# Patient Record
Sex: Female | Born: 1940 | Race: White | Hispanic: No | Smoking: Former smoker
Health system: Southern US, Community
[De-identification: ages and names within clinical notes are randomized; demographics above are authoritative.]

## PROBLEM LIST (undated history)

## (undated) DIAGNOSIS — I10 Essential (primary) hypertension: Secondary | ICD-10-CM

## (undated) DIAGNOSIS — E119 Type 2 diabetes mellitus without complications: Secondary | ICD-10-CM

## (undated) HISTORY — PX: APPENDECTOMY: SHX54

## (undated) HISTORY — PX: CHOLECYSTECTOMY: SHX55

## (undated) HISTORY — PX: ABDOMINAL HYSTERECTOMY: SHX81

---

## 2015-12-11 ENCOUNTER — Encounter (HOSPITAL_BASED_OUTPATIENT_CLINIC_OR_DEPARTMENT_OTHER): Payer: Self-pay | Admitting: *Deleted

## 2015-12-11 ENCOUNTER — Emergency Department (HOSPITAL_BASED_OUTPATIENT_CLINIC_OR_DEPARTMENT_OTHER): Payer: Medicare HMO

## 2015-12-11 ENCOUNTER — Emergency Department (HOSPITAL_BASED_OUTPATIENT_CLINIC_OR_DEPARTMENT_OTHER)
Admission: EM | Admit: 2015-12-11 | Discharge: 2015-12-11 | Disposition: A | Discharge status: Home / Self Care | Payer: Medicare HMO | Attending: Emergency Medicine | Admitting: Emergency Medicine

## 2015-12-11 DIAGNOSIS — E119 Type 2 diabetes mellitus without complications: Secondary | ICD-10-CM | POA: Diagnosis not present

## 2015-12-11 DIAGNOSIS — Y939 Activity, unspecified: Secondary | ICD-10-CM | POA: Insufficient documentation

## 2015-12-11 DIAGNOSIS — S62501A Fracture of unspecified phalanx of right thumb, initial encounter for closed fracture: Secondary | ICD-10-CM

## 2015-12-11 DIAGNOSIS — W010XXA Fall on same level from slipping, tripping and stumbling without subsequent striking against object, initial encounter: Secondary | ICD-10-CM | POA: Insufficient documentation

## 2015-12-11 DIAGNOSIS — I10 Essential (primary) hypertension: Secondary | ICD-10-CM | POA: Diagnosis not present

## 2015-12-11 DIAGNOSIS — Y999 Unspecified external cause status: Secondary | ICD-10-CM | POA: Insufficient documentation

## 2015-12-11 DIAGNOSIS — Z79899 Other long term (current) drug therapy: Secondary | ICD-10-CM | POA: Insufficient documentation

## 2015-12-11 DIAGNOSIS — S6991XA Unspecified injury of right wrist, hand and finger(s), initial encounter: Secondary | ICD-10-CM | POA: Diagnosis present

## 2015-12-11 DIAGNOSIS — Y92832 Beach as the place of occurrence of the external cause: Secondary | ICD-10-CM | POA: Diagnosis not present

## 2015-12-11 DIAGNOSIS — S62511A Displaced fracture of proximal phalanx of right thumb, initial encounter for closed fracture: Secondary | ICD-10-CM | POA: Diagnosis not present

## 2015-12-11 DIAGNOSIS — Z794 Long term (current) use of insulin: Secondary | ICD-10-CM | POA: Diagnosis not present

## 2015-12-11 HISTORY — DX: Type 2 diabetes mellitus without complications: E11.9

## 2015-12-11 HISTORY — DX: Essential (primary) hypertension: I10

## 2015-12-11 MED ORDER — HYDROCODONE-ACETAMINOPHEN 5-325 MG PO TABS: 1.0000 | ORAL_TABLET | Freq: Four times a day (QID) | ORAL | Status: DC | PRN

## 2015-12-11 NOTE — Discharge Instructions (Signed)
Continue to wear splint as applied.  Hydrocodone as prescribed as needed for pain.  Follow up with your orthopedist within the next week for a recheck.   Finger Fracture Fractures of fingers are breaks in the bones of the fingers. There are many types of fractures. There are different ways of treating these fractures. Your health care provider will discuss the best way to treat your fracture. CAUSES Traumatic injury is the main cause of broken fingers. These include:  Injuries while playing sports.  Workplace injuries.  Falls. RISK FACTORS Activities that can increase your risk of finger fractures include:  Sports.  Workplace activities that involve machinery.  A condition called osteoporosis, which can make your bones less dense and cause them to fracture more easily. SIGNS AND SYMPTOMS The main symptoms of a broken finger are pain and swelling within 15 minutes after the injury. Other symptoms include:  Bruising of your finger.  Stiffness of your finger.  Numbness of your finger.  Exposed bones (compound fracture) if the fracture is severe. DIAGNOSIS  The best way to diagnose a broken bone is with X-ray imaging. Additionally, your health care provider will use this X-ray image to evaluate the position of the broken finger bones.  TREATMENT  Finger fractures can be treated with:   Nonreduction--This means the bones are in place. The finger is splinted without changing the positions of the bone pieces. The splint is usually left on for about a week to 10 days. This will depend on your fracture and what your health care provider thinks.  Closed reduction--The bones are put back into position without using surgery. The finger is then splinted.  Open reduction and internal fixation--The fracture site is opened. Then the bone pieces are fixed into place with pins or some type of hardware. This is seldom required. It depends on the severity of the fracture. HOME CARE  INSTRUCTIONS   Follow your health care provider's instructions regarding activities, exercises, and physical therapy.  Only take over-the-counter or prescription medicines for pain, discomfort, or fever as directed by your health care provider. SEEK MEDICAL CARE IF: You have pain or swelling that limits the motion or use of your fingers. SEEK IMMEDIATE MEDICAL CARE IF:  Your finger becomes numb. MAKE SURE YOU:   Understand these instructions.  Will watch your condition.  Will get help right away if you are not doing well or get worse.   This information is not intended to replace advice given to you by your health care provider. Make sure you discuss any questions you have with your health care provider.   Document Released: 11/02/2000 Document Revised: 05/11/2013 Document Reviewed: 03/02/2013 Elsevier Interactive Patient Education [employer]

## 2015-12-11 NOTE — ED Provider Notes (Signed)
CSN: [id number]     Arrival date & time 12/11/15  1704 History   First MD Initiated Contact with Patient 12/11/15 1741     Chief Complaint  Patient presents with  . Hand Injury     (Consider location/radiation/quality/duration/timing/severity/associated sxs/prior Treatment) HPI Comments: Patient is a 75 year old female with history of hypertension and diabetes. She presents for evaluation of a right thumb injury. She reports that she fell [place] She [place] there and an x-ray revealed a fracture of her thumb. She was placed in a thumb spica splint and advised to follow-up with orthopedics when she arrived home. She has been unable to arrange orthopedic follow-up and presents here tonight to have her thumb evaluated.  Patient is a 75 y.o. female presenting with hand injury. The history is provided by the patient.  Hand Injury Location:  Hand Time since incident:  4 days Injury: yes   Hand location:  R hand Pain details:    Radiates to:  Does not radiate   Severity:  Moderate   Onset quality:  Sudden   Past Medical History  Diagnosis Date  . Diabetes mellitus without complication (HCC)   . Hypertension    Past Surgical History  Procedure Laterality Date  . Abdominal hysterectomy    . Cholecystectomy    . Appendectomy     No family history on file. Social History  Substance Use Topics  . Smoking status: Never Smoker   . Smokeless tobacco: None  . Alcohol Use: No   OB History    No data available     Review of Systems  All other systems reviewed and are negative.     Allergies  Codeine  Home Medications   Prior to Admission medications   Medication Sig Start Date End Date Taking? Authorizing Provider  insulin aspart (NOVOLOG) 100 unit/mL injection Inject 10 Units into the skin 3 (three) times daily before meals.   Yes Historical Provider, MD  Insulin Detemir (LEVEMIR) 100 UNIT/ML Pen Inject into the skin daily at 10 pm.   Yes  Historical Provider, MD  LISINOPRIL PO Take by mouth.   Yes Historical Provider, MD  Propranolol HCl (INDERAL PO) Take by mouth.   Yes Historical Provider, MD  TRAZODONE HCL PO Take by mouth.   Yes Historical Provider, MD   BP 130/73 mmHg  Pulse 77  Temp(Src) 99.5 F (37.5 C) (Oral)  Resp 18  Ht  (1.6 m)  Wt 168 lb (76.204 kg)  BMI 29.77 kg/m2  SpO2 96% Physical Exam  Constitutional: She is oriented to person, place, and time. She appears well-developed and well-nourished. No distress.  HENT:  Head: Normocephalic and atraumatic.  Neck: Normal range of motion. Neck supple.  Musculoskeletal:  There is some ecchymosis over the first metacarpal of the right hand. The thumb otherwise appears grossly normal. She has good range of motion of the wrist.  Neurological: She is alert and oriented to person, place, and time.  Skin: Skin is warm and dry. She is not diaphoretic.  Vitals reviewed.   ED Course  Procedures (including critical care time) Labs Review Labs Reviewed - No data to display  Imaging Review No results found. I have personally reviewed and evaluated these images and lab results as part of my medical decision-making.   EKG Interpretation None      MDM   Final diagnoses:  None    X-rays reveal a minimally displaced fracture at the base  of the right thumb. She is wearing a thumb spica which was applied at the outside clinic. I will advise her to continue to wear this and follow-up with her orthopedist within the next week.    Geoffery Lyons, MD 12/11/15 [phone]

## 2015-12-11 NOTE — ED Notes (Addendum)
Right thumb injury. She tripped and fell a week ago. [place] and xray showed a fracture. She is wearing a brace. Here for continued pain.

## 2016-04-21 ENCOUNTER — Emergency Department (HOSPITAL_BASED_OUTPATIENT_CLINIC_OR_DEPARTMENT_OTHER)
Admission: EM | Admit: 2016-04-21 | Discharge: 2016-04-22 | Disposition: A | Discharge status: Home / Self Care | Payer: Medicare HMO | Attending: Emergency Medicine | Admitting: Emergency Medicine

## 2016-04-21 ENCOUNTER — Emergency Department (HOSPITAL_BASED_OUTPATIENT_CLINIC_OR_DEPARTMENT_OTHER): Payer: Medicare HMO

## 2016-04-21 ENCOUNTER — Encounter (HOSPITAL_BASED_OUTPATIENT_CLINIC_OR_DEPARTMENT_OTHER): Payer: Self-pay

## 2016-04-21 DIAGNOSIS — R109 Unspecified abdominal pain: Secondary | ICD-10-CM | POA: Diagnosis not present

## 2016-04-21 DIAGNOSIS — R9431 Abnormal electrocardiogram [ECG] [EKG]: Secondary | ICD-10-CM | POA: Insufficient documentation

## 2016-04-21 DIAGNOSIS — Z87891 Personal history of nicotine dependence: Secondary | ICD-10-CM | POA: Insufficient documentation

## 2016-04-21 DIAGNOSIS — R079 Chest pain, unspecified: Secondary | ICD-10-CM | POA: Insufficient documentation

## 2016-04-21 DIAGNOSIS — Z79899 Other long term (current) drug therapy: Secondary | ICD-10-CM | POA: Diagnosis not present

## 2016-04-21 DIAGNOSIS — Z794 Long term (current) use of insulin: Secondary | ICD-10-CM | POA: Diagnosis not present

## 2016-04-21 DIAGNOSIS — I10 Essential (primary) hypertension: Secondary | ICD-10-CM | POA: Insufficient documentation

## 2016-04-21 DIAGNOSIS — E119 Type 2 diabetes mellitus without complications: Secondary | ICD-10-CM | POA: Insufficient documentation

## 2016-04-21 LAB — HEPATIC FUNCTION PANEL
ALBUMIN: 3.4 g/dL — AB (ref 3.5–5.0)
ALT: 22 U/L (ref 14–54)
AST: 26 U/L (ref 15–41)
Alkaline Phosphatase: 58 U/L (ref 38–126)
BILIRUBIN DIRECT: 0.2 mg/dL (ref 0.1–0.5)
BILIRUBIN TOTAL: 1.3 mg/dL — AB (ref 0.3–1.2)
Indirect Bilirubin: 1.1 mg/dL — ABNORMAL HIGH (ref 0.3–0.9)
Total Protein: 6.5 g/dL (ref 6.5–8.1)

## 2016-04-21 LAB — BASIC METABOLIC PANEL
Anion gap: 7 (ref 5–15)
BUN: 11 mg/dL (ref 6–20)
CALCIUM: 9.2 mg/dL (ref 8.9–10.3)
CHLORIDE: 103 mmol/L (ref 101–111)
CO2: 26 mmol/L (ref 22–32)
CREATININE: 0.43 mg/dL — AB (ref 0.44–1.00)
GFR calc Af Amer: >60 mL/min (ref >60)
GFR calc non Af Amer: >60 mL/min (ref >60)
Glucose, Bld: 321 mg/dL — ABNORMAL HIGH (ref 65–99)
Potassium: 4 mmol/L (ref 3.5–5.1)
SODIUM: 136 mmol/L (ref 135–145)

## 2016-04-21 LAB — CBC
HCT: 42.2 % (ref 36.0–46.0)
Hemoglobin: 14.4 g/dL (ref 12.0–15.0)
MCH: 30.5 pg (ref 26.0–34.0)
MCHC: 34.1 g/dL (ref 30.0–36.0)
MCV: 89.4 fL (ref 78.0–100.0)
PLATELETS: 88 10*3/uL — AB (ref 150–400)
RBC: 4.72 MIL/uL (ref 3.87–5.11)
RDW: 13.3 % (ref 11.5–15.5)
WBC: 3.6 10*3/uL — AB (ref 4.0–10.5)

## 2016-04-21 LAB — LIPASE, BLOOD: LIPASE: 119 U/L — AB (ref 11–51)

## 2016-04-21 LAB — TROPONIN I
Troponin I: <0.03 ng/mL (ref <0.03)
Troponin I: <0.03 ng/mL (ref <0.03)

## 2016-04-21 LAB — CBG MONITORING, ED
GLUCOSE-CAPILLARY: 251 mg/dL — AB (ref 65–99)
GLUCOSE-CAPILLARY: 280 mg/dL — AB (ref 65–99)

## 2016-04-21 MED ORDER — IOPAMIDOL (ISOVUE-370) INJECTION 76%
100.0000 mL | Freq: Once | INTRAVENOUS | Status: AC | PRN
  Administered 2016-04-21: 100 mL via INTRAVENOUS

## 2016-04-21 MED ORDER — TRAZODONE HCL 150 MG PO TABS
150.0000 mg | ORAL_TABLET | Freq: Every day | ORAL | Status: DC
  Filled 2016-04-21: qty 1

## 2016-04-21 MED ORDER — MORPHINE SULFATE (PF) 4 MG/ML IV SOLN
4.0000 mg | Freq: Once | INTRAVENOUS | Status: DC
  Filled 2016-04-21: qty 1

## 2016-04-21 MED ORDER — INSULIN ASPART 100 UNIT/ML ~~LOC~~ SOLN
0.0000 [IU] | Freq: Three times a day (TID) | SUBCUTANEOUS | Status: DC
  Administered 2016-04-21: 8 [IU] via SUBCUTANEOUS
  Filled 2016-04-21: qty 1

## 2016-04-21 MED ORDER — ONDANSETRON HCL 4 MG/2ML IJ SOLN
4.0000 mg | Freq: Once | INTRAMUSCULAR | Status: DC
  Filled 2016-04-21: qty 2

## 2016-04-21 MED ORDER — PRAMIPEXOLE DIHYDROCHLORIDE 1 MG PO TABS
1.0000 mg | ORAL_TABLET | Freq: Once | ORAL | Status: DC
  Filled 2016-04-21: qty 1

## 2016-04-21 MED ORDER — DIAZEPAM 2 MG PO TABS
2.0000 mg | ORAL_TABLET | Freq: Once | ORAL | Status: AC
  Administered 2016-04-21: 2 mg via ORAL
  Filled 2016-04-21: qty 1

## 2016-04-21 NOTE — ED Triage Notes (Signed)
Pt reports one day of centralized chest pain, states home BP 172/88. Pt reports abdominal and epigastric pain as well.

## 2016-04-21 NOTE — ED Provider Notes (Signed)
[facility] Provider Note   CSN: [id number] Arrival date & time: 04/21/16  1612   By signing my name below, I, Kimberly [name], attest that this documentation has been prepared under the direction and in the presence of [name], MD . Electronically Signed: Christel [name], Scribe. 04/21/2016. 5:09 PM.   History   Chief Complaint Chief Complaint  Patient presents with  . Chest Pain    The history is provided by the patient. No language interpreter was used.   HPI Comments:  Kimberly [name] is a 75 y.o. female with PMHx of cirrhosis, DM, CHF, HLD and HTN who presents to the Emergency Department complaining of constant, centralized abdominal pain and chest pain that began this morning upon waking up from sleep. Pt states that she was baseline when she went to sleep last night. Pt describes the abdominal pain as "aching" and rates the pain at 8/10, and describes the chest pain as "dull and pressure" Pt notes no alleviating or aggravating factors. Pain not worsened with exertion, by eating, or with position. Pt denies any previous similar episodes. Pt has PSHx of hysterectomy, appendectomy, and cholecystectomy. Pt denies fever, nausea, vomiting, diarrhea, urinary symptoms, SOB, cough, back pain, leg swelling, blood in stool.   Past Medical History:  Diagnosis Date  . Diabetes mellitus without complication (HCC)   . Hypertension     There are no active problems to display for this patient.   Past Surgical History:  Procedure Laterality Date  . ABDOMINAL HYSTERECTOMY    . APPENDECTOMY    . CHOLECYSTECTOMY      OB History    No data available       Home Medications    Prior to Admission medications   Medication Sig Start Date End Date Taking? Authorizing Provider  insulin aspart (NOVOLOG) 100 unit/mL injection Inject 10 Units into the skin 3 (three) times daily before meals.   Yes Historical Provider, MD  Insulin Detemir (LEVEMIR) 100 UNIT/ML Pen Inject into  the skin daily at 10 pm.   Yes Historical Provider, MD  LISINOPRIL PO Take by mouth.   Yes Historical Provider, MD  Propranolol HCl (INDERAL PO) Take by mouth.   Yes Historical Provider, MD  TRAZODONE HCL PO Take by mouth.   Yes Historical Provider, MD  HYDROcodone-acetaminophen (NORCO) 5-325 MG tablet Take 1-2 tablets by mouth every 6 (six) hours as needed. 12/11/15   Geoffery Lyons, MD    Family History History reviewed. No pertinent family history.  Social History Social History  Substance Use Topics  . Smoking status: Former Games developer  . Smokeless tobacco: Never Used  . Alcohol use No     Allergies   Codeine   Review of Systems Review of Systems  Constitutional: Negative for fever.  Respiratory: Negative for cough and shortness of breath.   Cardiovascular: Positive for chest pain. Negative for leg swelling.  Gastrointestinal: Positive for abdominal pain. Negative for blood in stool, diarrhea, nausea and vomiting.  Genitourinary: Negative for dysuria.  Musculoskeletal: Negative for back pain.  All other systems reviewed and are negative.    Physical Exam Updated Vital Signs BP 123/69   Pulse 61   Temp 97.8 F (36.6 C) (Oral)   Resp 18   Ht 5\' 3"  (1.6 m)   Wt 164 lb (74.4 kg)   SpO2 98%   BMI 29.05 kg/m   Physical Exam Physical Exam  Nursing note and vitals reviewed. Constitutional: Well developed, well nourished, non-toxic, and in no  acute distress Head: Normocephalic and atraumatic.  Mouth/Throat: Oropharynx is clear and moist.  Neck: Normal range of motion. Neck supple.  Cardiovascular: Normal rate and regular rhythm.   Pulmonary/Chest: Effort normal and breath sounds normal.  Abdominal: Soft, non-distended. Tenderness to RUQ. There is no rebound and no guarding.  Musculoskeletal: Normal range of motion.  Neurological: Alert, no facial droop, fluent speech, moves all extremities symmetrically Skin: Skin is warm and dry.  Psychiatric: Cooperative  ED  Treatments / Results  DIAGNOSTIC STUDIES:  Oxygen Saturation is 99% on RA, normal by my interpretation.    COORDINATION OF CARE:  5:09 PM Discussed treatment plan with pt at bedside and pt agreed to plan.   Labs (all labs ordered are listed, but only abnormal results are displayed) Labs Reviewed  BASIC METABOLIC PANEL - Abnormal; Notable for the following:       Result Value   Glucose, Bld 321 (*)    Creatinine, Ser 0.43 (*)    All other components within normal limits  CBC - Abnormal; Notable for the following:    WBC 3.6 (*)    Platelets 88 (*)    All other components within normal limits  HEPATIC FUNCTION PANEL - Abnormal; Notable for the following:    Albumin 3.4 (*)    Total Bilirubin 1.3 (*)    Indirect Bilirubin 1.1 (*)    All other components within normal limits  LIPASE, BLOOD - Abnormal; Notable for the following:    Lipase 119 (*)    All other components within normal limits  CBG MONITORING, ED - Abnormal; Notable for the following:    Glucose-Capillary 251 (*)    All other components within normal limits  CBG MONITORING, ED - Abnormal; Notable for the following:    Glucose-Capillary 280 (*)    All other components within normal limits  TROPONIN I  TROPONIN I  TROPONIN I    EKG  EKG Interpretation  Date/Time:  Monday April 21 2016 16:19:04 EDT Ventricular Rate:  57 PR Interval:  176 QRS Duration: 78 QT Interval:  460 QTC Calculation: 447 R Axis:   -12 Text Interpretation:  Sinus bradycardia Left ventricular hypertrophy ST & T wave abnormality, consider lateral ischemia Abnormal ECG no prior EKG for comparison Confirmed by LIU MD, DANA [id number]) on 04/21/2016 5:07:35 PM       Radiology Dg Chest 2 View  Result Date: 04/21/2016 CLINICAL DATA:  Chest and abdominal pain today EXAM: CHEST  2 VIEW COMPARISON:  None. FINDINGS: Heart and mediastinal contours are within normal limits. No focal opacities or effusions. No acute bony abnormality. IMPRESSION:  No active cardiopulmonary disease. Electronically Signed   By: Charlett Nose M.D.   On: 04/21/2016 16:46   Ct Angio Chest Aorta W And/or Wo Contrast  Result Date: 04/21/2016 CLINICAL DATA:  Low abdominal pain radiating into chest. EXAM: CT ANGIOGRAPHY CHEST, ABDOMEN AND PELVIS TECHNIQUE: Multidetector CT imaging through the chest, abdomen and pelvis was performed using the standard protocol during bolus administration of intravenous contrast. Multiplanar reconstructed images and MIPs were obtained and reviewed to evaluate the vascular anatomy. CONTRAST:  100 cc Isovue 370 intravenous COMPARISON:  None. FINDINGS: CTA CHEST FINDINGS Cardiovascular: Exam optimized to evaluate the aorta. Precontrast imaging shows no intramural hematoma. Negative for dissection, aneurysm, or penetrating ulcer. Three vessel arch with patent great vessels. Diffuse atherosclerotic calcification of the aorta and coronaries. Mild cardiomegaly without pericardial effusion. Mild calcification of the aortic valve. No evidence of pulmonary embolism. Mediastinum: No  mass or adenopathy Lungs/Pleura: There is no edema, consolidation, effusion, or pneumothorax. Musculoskeletal: No acute or aggressive finding.  Spondylosis. Review of the MIP images confirms the above findings. CTA ABDOMEN AND PELVIS FINDINGS VASCULAR Aorta: No dissection, vasculitis or significant stenosis. Bilobed ectasia of the infrarenal aorta measuring up to 25 mm in maximal diameter. Celiac: Patent. No flow limiting stenosis. Hypertrophied hepatic artery in the setting this cirrhosis. SMA: Atherosclerotic plaque at the ostium without stenosis. Renals: Accessory left lower pole renal artery. Atheromatous changes at the ostia without evidence of flow limiting stenosis. No beading or dissection. IMA: Patent Inflow: Atherosclerotic calcification.  No stenosis or dissection. Veins: Systemic veins are non-opacified. No distension or unexpected density. Portal hypertension with  splenorenal varices Review of the MIP images confirms the above findings. NON-VASCULAR Hepatobiliary: Cirrhotic liver morphology with lobulated liver surface and caudate lobe enlargement.Cholecystectomy. Negative common bile duct. Pancreas: Unremarkable. Spleen: Mild enlargement in the setting of portal hypertension Adrenals/Urinary Tract: Negative adrenals. No hydronephrosis or stone. Bilateral simple renal cysts. Unremarkable bladder. Stomach/Bowel: No obstruction. No inflammatory changes. Mild distal colonic diverticulosis. Lymphatic:   No mass or adenopathy. Reproductive:Hysterectomy.  Negative adnexa. Other: No ascites or pneumoperitoneum. Musculoskeletal: No acute abnormalities. Review of the MIP images confirms the above findings. IMPRESSION: 1. No acute finding.  No evidence of acute aortic syndrome. 2. Cirrhosis with splenorenal varices. 3. Extensive atherosclerosis. 4. 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 Recommend followup by ultrasound in 5 years. This recommendation follows ACR consensus guidelines: White Paper of the ACR Incidental Findings Committee II on Vascular Findings. J Am Coll Radiol 2013; 10:789-794. Electronically Signed   By: Marnee SpringJonathon  Watts M.D.   On: 04/21/2016 18:16   Ct Angio Abd/pel W And/or Wo Contrast  Result Date: 04/21/2016 CLINICAL DATA:  Low abdominal pain radiating into chest. EXAM: CT ANGIOGRAPHY CHEST, ABDOMEN AND PELVIS TECHNIQUE: Multidetector CT imaging through the chest, abdomen and pelvis was performed using the standard protocol during bolus administration of intravenous contrast. Multiplanar reconstructed images and MIPs were obtained and reviewed to evaluate the vascular anatomy. CONTRAST:  100 cc Isovue 370 intravenous COMPARISON:  None. FINDINGS: CTA CHEST FINDINGS Cardiovascular: Exam optimized to evaluate the aorta. Precontrast imaging shows no intramural hematoma. Negative for dissection, aneurysm, or penetrating ulcer. Three vessel  arch with patent great vessels. Diffuse atherosclerotic calcification of the aorta and coronaries. Mild cardiomegaly without pericardial effusion. Mild calcification of the aortic valve. No evidence of pulmonary embolism. Mediastinum: No mass or adenopathy Lungs/Pleura: There is no edema, consolidation, effusion, or pneumothorax. Musculoskeletal: No acute or aggressive finding.  Spondylosis. Review of the MIP images confirms the above findings. CTA ABDOMEN AND PELVIS FINDINGS VASCULAR Aorta: No dissection, vasculitis or significant stenosis. Bilobed ectasia of the infrarenal aorta measuring up to 25 mm in maximal diameter. Celiac: Patent. No flow limiting stenosis. Hypertrophied hepatic artery in the setting this cirrhosis. SMA: Atherosclerotic plaque at the ostium without stenosis. Renals: Accessory left lower pole renal artery. Atheromatous changes at the ostia without evidence of flow limiting stenosis. No beading or dissection. IMA: Patent Inflow: Atherosclerotic calcification.  No stenosis or dissection. Veins: Systemic veins are non-opacified. No distension or unexpected density. Portal hypertension with splenorenal varices Review of the MIP images confirms the above findings. NON-VASCULAR Hepatobiliary: Cirrhotic liver morphology with lobulated liver surface and caudate lobe enlargement.Cholecystectomy. Negative common bile duct. Pancreas: Unremarkable. Spleen: Mild enlargement in the setting of portal hypertension Adrenals/Urinary Tract: Negative adrenals. No hydronephrosis or stone. Bilateral simple renal cysts. Unremarkable  bladder. Stomach/Bowel: No obstruction. No inflammatory changes. Mild distal colonic diverticulosis. Lymphatic:   No mass or adenopathy. Reproductive:Hysterectomy.  Negative adnexa. Other: No ascites or pneumoperitoneum. Musculoskeletal: No acute abnormalities. Review of the MIP images confirms the above findings. IMPRESSION: 1. No acute finding.  No evidence of acute aortic syndrome.  2. Cirrhosis with splenorenal varices. 3. Extensive atherosclerosis. 4. 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 Recommend followup by ultrasound in 5 years. This recommendation follows ACR consensus guidelines: White Paper of the ACR Incidental Findings Committee II on Vascular Findings. J Am Coll Radiol 2013; 10:789-794. Electronically Signed   By: Marnee Spring M.D.   On: 04/21/2016 18:16    Procedures Procedures (including critical care time)  Medications Ordered in ED Medications  morphine 4 MG/ML injection 4 mg (0 mg Intravenous Hold 04/21/16 1852)  ondansetron (ZOFRAN) injection 4 mg (0 mg Intravenous Hold 04/21/16 1852)  insulin aspart (novoLOG) injection 0-15 Units (8 Units Subcutaneous Given 04/21/16 2043)  iopamidol (ISOVUE-370) 76 % injection 100 mL (100 mLs Intravenous Contrast Given 04/21/16 1733)  diazepam (VALIUM) tablet 2 mg (2 mg Oral Given 04/21/16 2349)     Initial Impression / Assessment and Plan / ED Course  I have reviewed the triage vital signs and the nursing notes.  Pertinent labs & imaging results that were available during my care of the patient were reviewed by me and considered in my medical decision making (see chart for details).  Clinical Course    Patient with central abdominal pain and chest pain since this morning. Persistent symptoms on presentation, but self resolved during ED course. Vital signs stable. She is non-toxic and in no acute distress. Abdomen non-surgical. Cardiopulmonary exam unremarkable. Did perform CTA chest/abdominal pelvis to r/o dissection for aortic aneurysm. This is negative for aortic pathology. CT also showing no obstruction, infection or other intraabdominal processes. No evidence of PNA, edema, or other intrathoracic processes.  Heart score 5 with abnormal EKG. ST depressions inferolaterally with no prior EKG for comparison. Old records reviewed from care everywhere and no scanned EKGs for comparison.  Unclear if this is new or old. Troponin is negative x 1. Given high risk ACS, discussed admission for chest pain r/o. Patient requesting admission to [facility] as cardiologists are there. Discussed with Dr. Lucianne Muss, who will admit to [facility].  Final Clinical Impressions(s) / ED Diagnoses   Final diagnoses:  Nonspecific chest pain  Abnormal EKG    New Prescriptions New Prescriptions   No medications on file    I personally performed the services described in this documentation, which was scribed in my presence. The recorded information has been reviewed and is accurate.     [name], MD 04/22/16 [phone]

## 2016-04-21 NOTE — Progress Notes (Signed)
Patient ID: Kimberly [name], female   DOB: 12-Sep-1940, 75 y.o.   MRN: [id number]    Accepted to observation/telemetry bed for chest pain workup.  Please call the floor manager up on patient's arrival to the floor at extension [phone] for admitting physician assignment.   Per Dr. Crista Curbana Liu  . Chest Pain    The history is provided by the patient. No language interpreter was used.   HPI Comments:  Kimberly ClarksGloria Fitz is a 75 y.o. female with PMHx of cirrhosis of the liver, DM, CHF, and HTN who presents to the Emergency Department complaining of constant, centralized abdominal pain radiating to the chest that began this morning. Pt states that she was baseline when she went to sleep last night. Pt describes the abdominal pain as "aching" and rates the pain at 8/10, and describes the chest pain as "dull." Pt notes no modifying factors. Pt denies any previous similar episodes. Pt has PSHx of hysterectomy, appendectomy, and cholecystectomy. Pt denies fever, nausea, vomiting, diarrhea, urinary symptoms, SOB, cough, back pain, leg swelling, blood in stool.     Specimen Source: Vein   CBG monitoring, ED [[id number]] (Abnormal) Collected: 04/21/16 2159  Updated: 04/21/16 2200    Glucose-Capillary 280 (H) mg/dL  CBG monitoring, ED [[id number]][[id number]] (Abnormal) Collected: 04/21/16 2012  Updated: 04/21/16 2015    Glucose-Capillary 251 (H) mg/dL  Hepatic function panel [[id number]][[id number]] (Abnormal) Collected: 04/21/16 1640  Updated: 04/21/16 1803   Specimen Type: Blood   Specimen Source: Vein    Total Protein 6.5 g/dL   Albumin 3.4 (L) g/dL   AST 26 U/L   ALT 22 U/L   Alkaline Phosphatase 58 U/L   Total Bilirubin 1.3 (H) mg/dL   Bilirubin, Direct 0.2 mg/dL   Indirect Bilirubin 1.1 (H) mg/dL  Lipase, blood [[id number]][[id number]] (Abnormal) Collected: 04/21/16 1640  Updated: 04/21/16 1803   Specimen Type: Blood   Specimen Source: Vein    Lipase 119 (H) U/L  CBC [[id number]][[id number]] (Abnormal) Collected: 04/21/16 1640  Updated:  04/21/16 1733   Specimen Type: Blood    WBC 3.6 (L) K/uL   RBC 4.72 MIL/uL   Hemoglobin 14.4 g/dL   HCT 43.342.2 %   MCV 29.589.4 fL   MCH 30.5 pg   MCHC 34.1 g/dL   RDW 18.813.3 %   Platelets 88 (L) K/uL  Troponin I [[id number]][[id number]] Collected: 04/21/16 1640  Updated: 04/21/16 1718   Specimen Type: Blood    Troponin I <0.03 ng/mL  Basic metabolic panel [[id number]][[id number]] (Abnormal) Collected: 04/21/16 1640  Updated: 04/21/16 1706   Specimen Type: Blood    Sodium 136 mmol/L   Potassium 4.0 mmol/L   Chloride 103 mmol/L   CO2 26 mmol/L   Glucose, Bld 321 (H) mg/dL   BUN 11 mg/dL   Creatinine, Ser 5.730.43 (L) mg/dL   Calcium 9.2 mg/dL   GFR calc non Af Amer >60 mL/min   GFR calc Af Amer >60 mL/min   Anion gap    EKG Vent. rate 57 BPM PR interval 176 ms QRS duration 78 ms QT/QTc 460/447 ms P-R-T axes 48 -12 -23 Sinus bradycardia Left ventricular hypertrophy ST & T wave abnormality, consider lateral ischemia Abnormal ECG  Chest radiograph  no active cardiopulmonary disease.  CT angiogram chest Cardiovascular: Exam optimized to evaluate the aorta. Precontrast imaging shows no intramural hematoma. Negative for dissection, aneurysm, or penetrating ulcer. Three vessel arch with patent great vessels. Diffuse atherosclerotic calcification of the aorta and coronaries. Mild cardiomegaly without pericardial effusion. Mild calcification of the aortic  valve. No evidence of pulmonary embolism.  CT angiogram abdomen/pelvis IMPRESSION: 1. No acute finding.  No evidence of acute aortic syndrome. 2. Cirrhosis with splenorenal varices. 3. Extensive atherosclerosis. 4. 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 Recommend followup by ultrasound in 5 years. This recommendation follows ACR consensus guidelines: White Paper of the ACR Incidental Findings Committee II on Vascular Findings. J Am Coll Radiol 2013; 10:789-794.  Sanda Klein, M.D. Pager: [phone].

## 2016-04-21 NOTE — ED Notes (Signed)
Patient resting at this time. Patient eating dinner provided by this nurse. Denies any other needs at this time.

## 2016-04-21 NOTE — ED Notes (Signed)
Patient transported to CT 

## 2016-04-22 LAB — TROPONIN I: Troponin I: <0.03 ng/mL (ref <0.03)

## 2016-04-22 MED ORDER — DIPHENHYDRAMINE HCL 50 MG/ML IJ SOLN
INTRAMUSCULAR | Status: AC
  Administered 2016-04-22: 25 mg
  Filled 2016-04-22: qty 1

## 2016-04-22 MED ORDER — HYDROXYZINE HCL 25 MG PO TABS
25.0000 mg | ORAL_TABLET | Freq: Once | ORAL | Status: AC
  Administered 2016-04-22: 25 mg via ORAL
  Filled 2016-04-22: qty 1

## 2016-04-22 MED ORDER — ZOLPIDEM TARTRATE 5 MG PO TABS
5.0000 mg | ORAL_TABLET | Freq: Every evening | ORAL | Status: DC | PRN
  Filled 2016-04-22: qty 1

## 2016-04-22 NOTE — ED Provider Notes (Signed)
Patient received in signout. Chest pain rule out awaiting bed at [facility]. Patient has been without chest pain all night. She has had 2 troponins negative. Repeat troponin ordered at 6 AM. Repeat EKG below with improved ST changes when compared to initial EKG. History is atypical for ACS. Patient has a full CT angiogram chest abdomen and pelvis that is reassuring. She does need follow-up ultrasound in 5 years given appearance of aorta.  7:05 AM Repeat troponin negative. Discussed with the patient. Feel that she is adequately ruled out at this point for ACS. She does have a cardiologist and will follow-up closely. I have discussed with her aortic findings and need for follow-up with ultrasound.   EKG Interpretation  Date/Time:  Tuesday April 22 2016 06:16:37 EDT Ventricular Rate:  65 PR Interval:  176 QRS Duration: 89 QT Interval:  440 QTC Calculation: 458 R Axis:   4 Text Interpretation:  Sinus rhythm Probable left atrial enlargement Abnormal R-wave progression, early transition LVH with secondary repolarization abnormality ST and T wave changes improved from prior Confirmed by Wilkie Aye  MD, Chyna Kneece ([id number]) on 04/22/2016 6:21:15 AM         Shon Baton, MD 04/22/16 [phone]

## 2016-04-22 NOTE — Discharge Instructions (Signed)
You were seen today for chest and abdominal pain. Your work as been reassuring. You had 3 troponin tests that were negative. Your EKG improved. You were asymptomatic at time of discharge. It is very important that you follow-up with your cardiologist for repeat evaluation. If you have recurrent pain or any worsening symptoms she needs to be reevaluated immediately.  You also need ultrasound imaging of your aorta in approximately 5 years to monitor for possible aneurysm. You do not have aneurysm at this time but you have arthrosclerosis.

## 2016-08-12 DIAGNOSIS — K7469 Other cirrhosis of liver: Secondary | ICD-10-CM | POA: Diagnosis not present

## 2016-08-12 DIAGNOSIS — Z90710 Acquired absence of both cervix and uterus: Secondary | ICD-10-CM | POA: Diagnosis not present

## 2016-08-12 DIAGNOSIS — Z79899 Other long term (current) drug therapy: Secondary | ICD-10-CM | POA: Diagnosis not present

## 2016-08-12 DIAGNOSIS — D696 Thrombocytopenia, unspecified: Secondary | ICD-10-CM | POA: Diagnosis not present

## 2016-08-12 DIAGNOSIS — R946 Abnormal results of thyroid function studies: Secondary | ICD-10-CM | POA: Diagnosis not present

## 2016-08-12 DIAGNOSIS — E119 Type 2 diabetes mellitus without complications: Secondary | ICD-10-CM | POA: Diagnosis not present

## 2016-08-12 DIAGNOSIS — D509 Iron deficiency anemia, unspecified: Secondary | ICD-10-CM | POA: Diagnosis not present

## 2016-08-12 DIAGNOSIS — Z7984 Long term (current) use of oral hypoglycemic drugs: Secondary | ICD-10-CM | POA: Diagnosis not present

## 2016-08-12 DIAGNOSIS — K766 Portal hypertension: Secondary | ICD-10-CM | POA: Diagnosis not present

## 2016-08-12 DIAGNOSIS — D500 Iron deficiency anemia secondary to blood loss (chronic): Secondary | ICD-10-CM | POA: Diagnosis not present

## 2016-08-12 DIAGNOSIS — K7460 Unspecified cirrhosis of liver: Secondary | ICD-10-CM | POA: Diagnosis not present

## 2016-08-12 DIAGNOSIS — Z9049 Acquired absence of other specified parts of digestive tract: Secondary | ICD-10-CM | POA: Diagnosis not present

## 2016-08-12 DIAGNOSIS — K7581 Nonalcoholic steatohepatitis (NASH): Secondary | ICD-10-CM | POA: Diagnosis not present

## 2016-08-12 DIAGNOSIS — K31819 Angiodysplasia of stomach and duodenum without bleeding: Secondary | ICD-10-CM | POA: Diagnosis not present

## 2016-08-27 DIAGNOSIS — M79672 Pain in left foot: Secondary | ICD-10-CM | POA: Diagnosis not present

## 2016-08-27 DIAGNOSIS — S92302A Fracture of unspecified metatarsal bone(s), left foot, initial encounter for closed fracture: Secondary | ICD-10-CM | POA: Diagnosis not present

## 2016-09-02 DIAGNOSIS — D509 Iron deficiency anemia, unspecified: Secondary | ICD-10-CM | POA: Diagnosis not present

## 2016-09-06 DIAGNOSIS — Z87891 Personal history of nicotine dependence: Secondary | ICD-10-CM | POA: Diagnosis not present

## 2016-09-06 DIAGNOSIS — E1142 Type 2 diabetes mellitus with diabetic polyneuropathy: Secondary | ICD-10-CM | POA: Diagnosis not present

## 2016-09-06 DIAGNOSIS — N390 Urinary tract infection, site not specified: Secondary | ICD-10-CM | POA: Diagnosis not present

## 2016-09-06 DIAGNOSIS — R4182 Altered mental status, unspecified: Secondary | ICD-10-CM | POA: Diagnosis not present

## 2016-09-06 DIAGNOSIS — E559 Vitamin D deficiency, unspecified: Secondary | ICD-10-CM | POA: Diagnosis not present

## 2016-09-06 DIAGNOSIS — R630 Anorexia: Secondary | ICD-10-CM | POA: Diagnosis not present

## 2016-09-06 DIAGNOSIS — R404 Transient alteration of awareness: Secondary | ICD-10-CM | POA: Diagnosis not present

## 2016-09-06 DIAGNOSIS — R531 Weakness: Secondary | ICD-10-CM | POA: Diagnosis not present

## 2016-09-06 DIAGNOSIS — E119 Type 2 diabetes mellitus without complications: Secondary | ICD-10-CM | POA: Diagnosis not present

## 2016-09-06 DIAGNOSIS — I8500 Esophageal varices without bleeding: Secondary | ICD-10-CM | POA: Diagnosis not present

## 2016-09-06 DIAGNOSIS — K766 Portal hypertension: Secondary | ICD-10-CM | POA: Diagnosis not present

## 2016-09-06 DIAGNOSIS — I110 Hypertensive heart disease with heart failure: Secondary | ICD-10-CM | POA: Diagnosis not present

## 2016-09-06 DIAGNOSIS — E1165 Type 2 diabetes mellitus with hyperglycemia: Secondary | ICD-10-CM | POA: Diagnosis not present

## 2016-09-06 DIAGNOSIS — D696 Thrombocytopenia, unspecified: Secondary | ICD-10-CM | POA: Diagnosis not present

## 2016-09-06 DIAGNOSIS — I350 Nonrheumatic aortic (valve) stenosis: Secondary | ICD-10-CM | POA: Diagnosis not present

## 2016-09-06 DIAGNOSIS — Z780 Asymptomatic menopausal state: Secondary | ICD-10-CM | POA: Diagnosis not present

## 2016-09-06 DIAGNOSIS — G2581 Restless legs syndrome: Secondary | ICD-10-CM | POA: Diagnosis not present

## 2016-09-06 DIAGNOSIS — G9340 Encephalopathy, unspecified: Secondary | ICD-10-CM | POA: Diagnosis not present

## 2016-09-06 DIAGNOSIS — K7460 Unspecified cirrhosis of liver: Secondary | ICD-10-CM | POA: Diagnosis not present

## 2016-09-06 DIAGNOSIS — K7581 Nonalcoholic steatohepatitis (NASH): Secondary | ICD-10-CM | POA: Diagnosis not present

## 2016-09-06 DIAGNOSIS — I50810 Right heart failure, unspecified: Secondary | ICD-10-CM | POA: Diagnosis not present

## 2016-09-06 DIAGNOSIS — M609 Myositis, unspecified: Secondary | ICD-10-CM | POA: Diagnosis not present

## 2016-09-06 DIAGNOSIS — R740 Nonspecific elevation of levels of transaminase and lactic acid dehydrogenase [LDH]: Secondary | ICD-10-CM | POA: Diagnosis not present

## 2016-09-06 DIAGNOSIS — G92 Toxic encephalopathy: Secondary | ICD-10-CM | POA: Diagnosis not present

## 2016-09-06 DIAGNOSIS — F329 Major depressive disorder, single episode, unspecified: Secondary | ICD-10-CM | POA: Diagnosis not present

## 2016-09-06 DIAGNOSIS — R195 Other fecal abnormalities: Secondary | ICD-10-CM | POA: Diagnosis not present

## 2016-09-06 DIAGNOSIS — K648 Other hemorrhoids: Secondary | ICD-10-CM | POA: Diagnosis not present

## 2016-09-06 DIAGNOSIS — E039 Hypothyroidism, unspecified: Secondary | ICD-10-CM | POA: Diagnosis not present

## 2016-09-06 DIAGNOSIS — E8809 Other disorders of plasma-protein metabolism, not elsewhere classified: Secondary | ICD-10-CM | POA: Diagnosis not present

## 2016-09-06 DIAGNOSIS — D509 Iron deficiency anemia, unspecified: Secondary | ICD-10-CM | POA: Diagnosis not present

## 2016-09-06 DIAGNOSIS — K449 Diaphragmatic hernia without obstruction or gangrene: Secondary | ICD-10-CM | POA: Diagnosis not present

## 2016-09-06 DIAGNOSIS — R930 Abnormal findings on diagnostic imaging of skull and head, not elsewhere classified: Secondary | ICD-10-CM | POA: Diagnosis not present

## 2016-09-07 DIAGNOSIS — G9340 Encephalopathy, unspecified: Secondary | ICD-10-CM | POA: Diagnosis not present

## 2016-09-08 DIAGNOSIS — I6782 Cerebral ischemia: Secondary | ICD-10-CM | POA: Diagnosis not present

## 2016-09-08 DIAGNOSIS — G9340 Encephalopathy, unspecified: Secondary | ICD-10-CM | POA: Diagnosis not present

## 2016-09-09 DIAGNOSIS — K766 Portal hypertension: Secondary | ICD-10-CM | POA: Diagnosis not present

## 2016-09-09 DIAGNOSIS — N390 Urinary tract infection, site not specified: Secondary | ICD-10-CM | POA: Diagnosis not present

## 2016-09-09 DIAGNOSIS — E118 Type 2 diabetes mellitus with unspecified complications: Secondary | ICD-10-CM | POA: Diagnosis not present

## 2016-09-09 DIAGNOSIS — I8510 Secondary esophageal varices without bleeding: Secondary | ICD-10-CM | POA: Diagnosis not present

## 2016-09-09 DIAGNOSIS — F329 Major depressive disorder, single episode, unspecified: Secondary | ICD-10-CM | POA: Diagnosis not present

## 2016-09-09 DIAGNOSIS — K7460 Unspecified cirrhosis of liver: Secondary | ICD-10-CM | POA: Diagnosis not present

## 2016-09-09 DIAGNOSIS — G9340 Encephalopathy, unspecified: Secondary | ICD-10-CM | POA: Diagnosis not present

## 2016-09-09 DIAGNOSIS — D500 Iron deficiency anemia secondary to blood loss (chronic): Secondary | ICD-10-CM | POA: Diagnosis not present

## 2016-09-09 DIAGNOSIS — K7581 Nonalcoholic steatohepatitis (NASH): Secondary | ICD-10-CM | POA: Diagnosis not present

## 2016-09-09 DIAGNOSIS — G2581 Restless legs syndrome: Secondary | ICD-10-CM | POA: Diagnosis not present

## 2016-09-09 DIAGNOSIS — D696 Thrombocytopenia, unspecified: Secondary | ICD-10-CM | POA: Diagnosis not present

## 2016-09-10 DIAGNOSIS — E7800 Pure hypercholesterolemia, unspecified: Secondary | ICD-10-CM | POA: Diagnosis not present

## 2016-09-10 DIAGNOSIS — R4182 Altered mental status, unspecified: Secondary | ICD-10-CM | POA: Diagnosis not present

## 2016-09-10 DIAGNOSIS — Z885 Allergy status to narcotic agent status: Secondary | ICD-10-CM | POA: Diagnosis not present

## 2016-09-10 DIAGNOSIS — K7581 Nonalcoholic steatohepatitis (NASH): Secondary | ICD-10-CM | POA: Diagnosis not present

## 2016-09-10 DIAGNOSIS — R1084 Generalized abdominal pain: Secondary | ICD-10-CM | POA: Diagnosis not present

## 2016-09-10 DIAGNOSIS — K7460 Unspecified cirrhosis of liver: Secondary | ICD-10-CM | POA: Diagnosis not present

## 2016-09-10 DIAGNOSIS — R531 Weakness: Secondary | ICD-10-CM | POA: Diagnosis not present

## 2016-09-10 DIAGNOSIS — E039 Hypothyroidism, unspecified: Secondary | ICD-10-CM | POA: Diagnosis not present

## 2016-09-10 DIAGNOSIS — R402421 Glasgow coma scale score 9-12, in the field [EMT or ambulance]: Secondary | ICD-10-CM | POA: Diagnosis not present

## 2016-09-10 DIAGNOSIS — F329 Major depressive disorder, single episode, unspecified: Secondary | ICD-10-CM | POA: Diagnosis not present

## 2016-09-10 DIAGNOSIS — Z87891 Personal history of nicotine dependence: Secondary | ICD-10-CM | POA: Diagnosis not present

## 2016-09-10 DIAGNOSIS — N390 Urinary tract infection, site not specified: Secondary | ICD-10-CM | POA: Diagnosis not present

## 2016-09-10 DIAGNOSIS — I8510 Secondary esophageal varices without bleeding: Secondary | ICD-10-CM | POA: Diagnosis not present

## 2016-09-10 DIAGNOSIS — I10 Essential (primary) hypertension: Secondary | ICD-10-CM | POA: Diagnosis not present

## 2016-09-10 DIAGNOSIS — R5382 Chronic fatigue, unspecified: Secondary | ICD-10-CM | POA: Diagnosis not present

## 2016-09-10 DIAGNOSIS — G9340 Encephalopathy, unspecified: Secondary | ICD-10-CM | POA: Diagnosis not present

## 2016-09-10 DIAGNOSIS — K760 Fatty (change of) liver, not elsewhere classified: Secondary | ICD-10-CM | POA: Diagnosis not present

## 2016-09-10 DIAGNOSIS — R2681 Unsteadiness on feet: Secondary | ICD-10-CM | POA: Diagnosis not present

## 2016-09-10 DIAGNOSIS — E118 Type 2 diabetes mellitus with unspecified complications: Secondary | ICD-10-CM | POA: Diagnosis not present

## 2016-09-10 DIAGNOSIS — E876 Hypokalemia: Secondary | ICD-10-CM | POA: Diagnosis not present

## 2016-09-10 DIAGNOSIS — D649 Anemia, unspecified: Secondary | ICD-10-CM | POA: Diagnosis not present

## 2016-09-10 DIAGNOSIS — R488 Other symbolic dysfunctions: Secondary | ICD-10-CM | POA: Diagnosis not present

## 2016-09-10 DIAGNOSIS — D696 Thrombocytopenia, unspecified: Secondary | ICD-10-CM | POA: Diagnosis not present

## 2016-09-10 DIAGNOSIS — I509 Heart failure, unspecified: Secondary | ICD-10-CM | POA: Diagnosis not present

## 2016-09-10 DIAGNOSIS — G2581 Restless legs syndrome: Secondary | ICD-10-CM | POA: Diagnosis not present

## 2016-09-10 DIAGNOSIS — E7220 Disorder of urea cycle metabolism, unspecified: Secondary | ICD-10-CM | POA: Diagnosis not present

## 2016-09-10 DIAGNOSIS — D500 Iron deficiency anemia secondary to blood loss (chronic): Secondary | ICD-10-CM | POA: Diagnosis not present

## 2016-09-10 DIAGNOSIS — R5381 Other malaise: Secondary | ICD-10-CM | POA: Diagnosis not present

## 2016-09-10 DIAGNOSIS — D509 Iron deficiency anemia, unspecified: Secondary | ICD-10-CM | POA: Diagnosis not present

## 2016-09-10 DIAGNOSIS — M6281 Muscle weakness (generalized): Secondary | ICD-10-CM | POA: Diagnosis not present

## 2016-09-11 DIAGNOSIS — K7460 Unspecified cirrhosis of liver: Secondary | ICD-10-CM | POA: Diagnosis not present

## 2016-09-11 DIAGNOSIS — D509 Iron deficiency anemia, unspecified: Secondary | ICD-10-CM | POA: Diagnosis not present

## 2016-09-11 DIAGNOSIS — E118 Type 2 diabetes mellitus with unspecified complications: Secondary | ICD-10-CM | POA: Diagnosis not present

## 2016-09-11 DIAGNOSIS — E039 Hypothyroidism, unspecified: Secondary | ICD-10-CM | POA: Diagnosis not present

## 2016-09-11 DIAGNOSIS — I10 Essential (primary) hypertension: Secondary | ICD-10-CM | POA: Diagnosis not present

## 2016-09-11 DIAGNOSIS — E876 Hypokalemia: Secondary | ICD-10-CM | POA: Diagnosis not present

## 2016-09-11 DIAGNOSIS — G9340 Encephalopathy, unspecified: Secondary | ICD-10-CM | POA: Diagnosis not present

## 2016-09-15 DIAGNOSIS — E7220 Disorder of urea cycle metabolism, unspecified: Secondary | ICD-10-CM | POA: Diagnosis not present

## 2016-09-15 DIAGNOSIS — R5382 Chronic fatigue, unspecified: Secondary | ICD-10-CM | POA: Diagnosis not present

## 2016-09-15 DIAGNOSIS — Z87891 Personal history of nicotine dependence: Secondary | ICD-10-CM | POA: Diagnosis not present

## 2016-09-15 DIAGNOSIS — K760 Fatty (change of) liver, not elsewhere classified: Secondary | ICD-10-CM | POA: Diagnosis not present

## 2016-09-15 DIAGNOSIS — I509 Heart failure, unspecified: Secondary | ICD-10-CM | POA: Diagnosis not present

## 2016-09-15 DIAGNOSIS — R4182 Altered mental status, unspecified: Secondary | ICD-10-CM | POA: Diagnosis not present

## 2016-09-15 DIAGNOSIS — Z885 Allergy status to narcotic agent status: Secondary | ICD-10-CM | POA: Diagnosis not present

## 2016-09-15 DIAGNOSIS — E7800 Pure hypercholesterolemia, unspecified: Secondary | ICD-10-CM | POA: Diagnosis not present

## 2016-10-09 DIAGNOSIS — D509 Iron deficiency anemia, unspecified: Secondary | ICD-10-CM | POA: Diagnosis not present

## 2016-10-09 DIAGNOSIS — G9340 Encephalopathy, unspecified: Secondary | ICD-10-CM | POA: Diagnosis not present

## 2016-10-09 DIAGNOSIS — K7460 Unspecified cirrhosis of liver: Secondary | ICD-10-CM | POA: Diagnosis not present

## 2016-10-09 DIAGNOSIS — M6281 Muscle weakness (generalized): Secondary | ICD-10-CM | POA: Diagnosis not present

## 2016-10-09 DIAGNOSIS — E118 Type 2 diabetes mellitus with unspecified complications: Secondary | ICD-10-CM | POA: Diagnosis not present

## 2016-10-09 DIAGNOSIS — I10 Essential (primary) hypertension: Secondary | ICD-10-CM | POA: Diagnosis not present

## 2016-10-10 DIAGNOSIS — M6281 Muscle weakness (generalized): Secondary | ICD-10-CM | POA: Diagnosis not present

## 2016-10-10 DIAGNOSIS — K7460 Unspecified cirrhosis of liver: Secondary | ICD-10-CM | POA: Diagnosis not present

## 2016-10-10 DIAGNOSIS — D509 Iron deficiency anemia, unspecified: Secondary | ICD-10-CM | POA: Diagnosis not present

## 2016-10-10 DIAGNOSIS — G9340 Encephalopathy, unspecified: Secondary | ICD-10-CM | POA: Diagnosis not present

## 2016-10-10 DIAGNOSIS — E118 Type 2 diabetes mellitus with unspecified complications: Secondary | ICD-10-CM | POA: Diagnosis not present

## 2016-10-10 DIAGNOSIS — I10 Essential (primary) hypertension: Secondary | ICD-10-CM | POA: Diagnosis not present

## 2016-10-13 DIAGNOSIS — K7460 Unspecified cirrhosis of liver: Secondary | ICD-10-CM | POA: Diagnosis not present

## 2016-10-13 DIAGNOSIS — I10 Essential (primary) hypertension: Secondary | ICD-10-CM | POA: Diagnosis not present

## 2016-10-13 DIAGNOSIS — D509 Iron deficiency anemia, unspecified: Secondary | ICD-10-CM | POA: Diagnosis not present

## 2016-10-13 DIAGNOSIS — E118 Type 2 diabetes mellitus with unspecified complications: Secondary | ICD-10-CM | POA: Diagnosis not present

## 2016-10-13 DIAGNOSIS — M6281 Muscle weakness (generalized): Secondary | ICD-10-CM | POA: Diagnosis not present

## 2016-10-13 DIAGNOSIS — G9340 Encephalopathy, unspecified: Secondary | ICD-10-CM | POA: Diagnosis not present

## 2016-10-14 DIAGNOSIS — K7460 Unspecified cirrhosis of liver: Secondary | ICD-10-CM | POA: Diagnosis not present

## 2016-10-14 DIAGNOSIS — D509 Iron deficiency anemia, unspecified: Secondary | ICD-10-CM | POA: Diagnosis not present

## 2016-10-14 DIAGNOSIS — M6281 Muscle weakness (generalized): Secondary | ICD-10-CM | POA: Diagnosis not present

## 2016-10-14 DIAGNOSIS — I10 Essential (primary) hypertension: Secondary | ICD-10-CM | POA: Diagnosis not present

## 2016-10-14 DIAGNOSIS — G9340 Encephalopathy, unspecified: Secondary | ICD-10-CM | POA: Diagnosis not present

## 2016-10-14 DIAGNOSIS — E118 Type 2 diabetes mellitus with unspecified complications: Secondary | ICD-10-CM | POA: Diagnosis not present

## 2016-10-29 DIAGNOSIS — E7220 Disorder of urea cycle metabolism, unspecified: Secondary | ICD-10-CM | POA: Diagnosis not present

## 2016-10-29 DIAGNOSIS — E7800 Pure hypercholesterolemia, unspecified: Secondary | ICD-10-CM | POA: Diagnosis not present

## 2016-10-29 DIAGNOSIS — D509 Iron deficiency anemia, unspecified: Secondary | ICD-10-CM | POA: Diagnosis not present

## 2016-10-29 DIAGNOSIS — I10 Essential (primary) hypertension: Secondary | ICD-10-CM | POA: Diagnosis not present

## 2016-10-31 DIAGNOSIS — E118 Type 2 diabetes mellitus with unspecified complications: Secondary | ICD-10-CM | POA: Diagnosis not present

## 2016-10-31 DIAGNOSIS — K7460 Unspecified cirrhosis of liver: Secondary | ICD-10-CM | POA: Diagnosis not present

## 2016-10-31 DIAGNOSIS — M6281 Muscle weakness (generalized): Secondary | ICD-10-CM | POA: Diagnosis not present

## 2016-10-31 DIAGNOSIS — G9340 Encephalopathy, unspecified: Secondary | ICD-10-CM | POA: Diagnosis not present

## 2016-10-31 DIAGNOSIS — I10 Essential (primary) hypertension: Secondary | ICD-10-CM | POA: Diagnosis not present

## 2016-10-31 DIAGNOSIS — D509 Iron deficiency anemia, unspecified: Secondary | ICD-10-CM | POA: Diagnosis not present

## 2016-11-05 DIAGNOSIS — G9340 Encephalopathy, unspecified: Secondary | ICD-10-CM | POA: Diagnosis not present

## 2016-11-05 DIAGNOSIS — M6281 Muscle weakness (generalized): Secondary | ICD-10-CM | POA: Diagnosis not present

## 2016-11-05 DIAGNOSIS — E1165 Type 2 diabetes mellitus with hyperglycemia: Secondary | ICD-10-CM | POA: Diagnosis not present

## 2016-11-05 DIAGNOSIS — K7460 Unspecified cirrhosis of liver: Secondary | ICD-10-CM | POA: Diagnosis not present

## 2016-11-05 DIAGNOSIS — I272 Pulmonary hypertension, unspecified: Secondary | ICD-10-CM | POA: Diagnosis not present

## 2016-11-05 DIAGNOSIS — Z1231 Encounter for screening mammogram for malignant neoplasm of breast: Secondary | ICD-10-CM | POA: Diagnosis not present

## 2016-11-05 DIAGNOSIS — E039 Hypothyroidism, unspecified: Secondary | ICD-10-CM | POA: Diagnosis not present

## 2016-11-05 DIAGNOSIS — K7581 Nonalcoholic steatohepatitis (NASH): Secondary | ICD-10-CM | POA: Diagnosis not present

## 2016-11-05 DIAGNOSIS — E7800 Pure hypercholesterolemia, unspecified: Secondary | ICD-10-CM | POA: Diagnosis not present

## 2016-11-05 DIAGNOSIS — E118 Type 2 diabetes mellitus with unspecified complications: Secondary | ICD-10-CM | POA: Diagnosis not present

## 2016-11-05 DIAGNOSIS — K769 Liver disease, unspecified: Secondary | ICD-10-CM | POA: Diagnosis not present

## 2016-11-05 DIAGNOSIS — Z794 Long term (current) use of insulin: Secondary | ICD-10-CM | POA: Diagnosis not present

## 2016-11-05 DIAGNOSIS — D509 Iron deficiency anemia, unspecified: Secondary | ICD-10-CM | POA: Diagnosis not present

## 2016-11-05 DIAGNOSIS — E7220 Disorder of urea cycle metabolism, unspecified: Secondary | ICD-10-CM | POA: Diagnosis not present

## 2016-11-05 DIAGNOSIS — I10 Essential (primary) hypertension: Secondary | ICD-10-CM | POA: Diagnosis not present

## 2016-11-05 DIAGNOSIS — E559 Vitamin D deficiency, unspecified: Secondary | ICD-10-CM | POA: Diagnosis not present

## 2016-11-12 DIAGNOSIS — D509 Iron deficiency anemia, unspecified: Secondary | ICD-10-CM | POA: Diagnosis not present

## 2016-11-12 DIAGNOSIS — E118 Type 2 diabetes mellitus with unspecified complications: Secondary | ICD-10-CM | POA: Diagnosis not present

## 2016-11-12 DIAGNOSIS — K7460 Unspecified cirrhosis of liver: Secondary | ICD-10-CM | POA: Diagnosis not present

## 2016-11-12 DIAGNOSIS — G9340 Encephalopathy, unspecified: Secondary | ICD-10-CM | POA: Diagnosis not present

## 2016-11-12 DIAGNOSIS — M6281 Muscle weakness (generalized): Secondary | ICD-10-CM | POA: Diagnosis not present

## 2016-11-12 DIAGNOSIS — I10 Essential (primary) hypertension: Secondary | ICD-10-CM | POA: Diagnosis not present

## 2016-11-24 DIAGNOSIS — D509 Iron deficiency anemia, unspecified: Secondary | ICD-10-CM | POA: Diagnosis not present

## 2016-11-24 DIAGNOSIS — K7581 Nonalcoholic steatohepatitis (NASH): Secondary | ICD-10-CM | POA: Diagnosis not present

## 2016-11-24 DIAGNOSIS — Z79899 Other long term (current) drug therapy: Secondary | ICD-10-CM | POA: Diagnosis not present

## 2016-11-24 DIAGNOSIS — M609 Myositis, unspecified: Secondary | ICD-10-CM | POA: Diagnosis not present

## 2016-11-24 DIAGNOSIS — B354 Tinea corporis: Secondary | ICD-10-CM | POA: Diagnosis not present

## 2016-11-24 DIAGNOSIS — K766 Portal hypertension: Secondary | ICD-10-CM | POA: Diagnosis not present

## 2016-11-24 DIAGNOSIS — D6959 Other secondary thrombocytopenia: Secondary | ICD-10-CM | POA: Diagnosis not present

## 2016-11-24 DIAGNOSIS — K7460 Unspecified cirrhosis of liver: Secondary | ICD-10-CM | POA: Diagnosis not present

## 2016-11-24 DIAGNOSIS — Z7984 Long term (current) use of oral hypoglycemic drugs: Secondary | ICD-10-CM | POA: Diagnosis not present

## 2016-11-24 DIAGNOSIS — K31819 Angiodysplasia of stomach and duodenum without bleeding: Secondary | ICD-10-CM | POA: Diagnosis not present

## 2016-11-24 DIAGNOSIS — D500 Iron deficiency anemia secondary to blood loss (chronic): Secondary | ICD-10-CM | POA: Diagnosis not present

## 2016-11-24 DIAGNOSIS — E119 Type 2 diabetes mellitus without complications: Secondary | ICD-10-CM | POA: Diagnosis not present

## 2016-11-24 DIAGNOSIS — Z90710 Acquired absence of both cervix and uterus: Secondary | ICD-10-CM | POA: Diagnosis not present

## 2016-11-24 DIAGNOSIS — Z806 Family history of leukemia: Secondary | ICD-10-CM | POA: Diagnosis not present

## 2016-11-24 DIAGNOSIS — Z9049 Acquired absence of other specified parts of digestive tract: Secondary | ICD-10-CM | POA: Diagnosis not present

## 2016-11-27 DIAGNOSIS — K7460 Unspecified cirrhosis of liver: Secondary | ICD-10-CM | POA: Diagnosis not present

## 2016-11-27 DIAGNOSIS — G9340 Encephalopathy, unspecified: Secondary | ICD-10-CM | POA: Diagnosis not present

## 2016-11-27 DIAGNOSIS — I10 Essential (primary) hypertension: Secondary | ICD-10-CM | POA: Diagnosis not present

## 2016-11-27 DIAGNOSIS — M6281 Muscle weakness (generalized): Secondary | ICD-10-CM | POA: Diagnosis not present

## 2016-11-27 DIAGNOSIS — E118 Type 2 diabetes mellitus with unspecified complications: Secondary | ICD-10-CM | POA: Diagnosis not present

## 2016-11-27 DIAGNOSIS — D509 Iron deficiency anemia, unspecified: Secondary | ICD-10-CM | POA: Diagnosis not present

## 2016-12-01 DIAGNOSIS — D509 Iron deficiency anemia, unspecified: Secondary | ICD-10-CM | POA: Diagnosis not present

## 2016-12-02 DIAGNOSIS — M6281 Muscle weakness (generalized): Secondary | ICD-10-CM | POA: Diagnosis not present

## 2016-12-02 DIAGNOSIS — E118 Type 2 diabetes mellitus with unspecified complications: Secondary | ICD-10-CM | POA: Diagnosis not present

## 2016-12-02 DIAGNOSIS — K7460 Unspecified cirrhosis of liver: Secondary | ICD-10-CM | POA: Diagnosis not present

## 2016-12-02 DIAGNOSIS — I10 Essential (primary) hypertension: Secondary | ICD-10-CM | POA: Diagnosis not present

## 2016-12-02 DIAGNOSIS — D509 Iron deficiency anemia, unspecified: Secondary | ICD-10-CM | POA: Diagnosis not present

## 2016-12-02 DIAGNOSIS — G9340 Encephalopathy, unspecified: Secondary | ICD-10-CM | POA: Diagnosis not present

## 2016-12-12 DIAGNOSIS — F09 Unspecified mental disorder due to known physiological condition: Secondary | ICD-10-CM | POA: Diagnosis not present

## 2016-12-12 DIAGNOSIS — K7460 Unspecified cirrhosis of liver: Secondary | ICD-10-CM | POA: Diagnosis not present

## 2016-12-12 DIAGNOSIS — E1165 Type 2 diabetes mellitus with hyperglycemia: Secondary | ICD-10-CM | POA: Diagnosis not present

## 2016-12-12 DIAGNOSIS — E039 Hypothyroidism, unspecified: Secondary | ICD-10-CM | POA: Diagnosis not present

## 2016-12-12 DIAGNOSIS — R269 Unspecified abnormalities of gait and mobility: Secondary | ICD-10-CM | POA: Diagnosis not present

## 2016-12-12 DIAGNOSIS — E1142 Type 2 diabetes mellitus with diabetic polyneuropathy: Secondary | ICD-10-CM | POA: Diagnosis not present

## 2016-12-12 DIAGNOSIS — E559 Vitamin D deficiency, unspecified: Secondary | ICD-10-CM | POA: Diagnosis not present

## 2016-12-12 DIAGNOSIS — Z794 Long term (current) use of insulin: Secondary | ICD-10-CM | POA: Diagnosis not present

## 2016-12-12 DIAGNOSIS — K7581 Nonalcoholic steatohepatitis (NASH): Secondary | ICD-10-CM | POA: Diagnosis not present

## 2016-12-12 DIAGNOSIS — I10 Essential (primary) hypertension: Secondary | ICD-10-CM | POA: Diagnosis not present

## 2017-01-06 ENCOUNTER — Emergency Department (HOSPITAL_COMMUNITY): Payer: PPO

## 2017-01-06 ENCOUNTER — Inpatient Hospital Stay (HOSPITAL_COMMUNITY)
Admission: EM | Admit: 2017-01-06 | Discharge: 2017-01-12 | DRG: 442 | Disposition: A | Discharge status: Home Health | Payer: PPO | Attending: Internal Medicine | Admitting: Internal Medicine

## 2017-01-06 ENCOUNTER — Encounter (HOSPITAL_COMMUNITY): Payer: Self-pay

## 2017-01-06 DIAGNOSIS — K766 Portal hypertension: Secondary | ICD-10-CM | POA: Diagnosis not present

## 2017-01-06 DIAGNOSIS — J9811 Atelectasis: Secondary | ICD-10-CM | POA: Diagnosis not present

## 2017-01-06 DIAGNOSIS — Z79899 Other long term (current) drug therapy: Secondary | ICD-10-CM

## 2017-01-06 DIAGNOSIS — I8500 Esophageal varices without bleeding: Secondary | ICD-10-CM | POA: Diagnosis present

## 2017-01-06 DIAGNOSIS — Z87891 Personal history of nicotine dependence: Secondary | ICD-10-CM | POA: Diagnosis not present

## 2017-01-06 DIAGNOSIS — I8510 Secondary esophageal varices without bleeding: Secondary | ICD-10-CM

## 2017-01-06 DIAGNOSIS — E1165 Type 2 diabetes mellitus with hyperglycemia: Secondary | ICD-10-CM | POA: Diagnosis present

## 2017-01-06 DIAGNOSIS — I10 Essential (primary) hypertension: Secondary | ICD-10-CM

## 2017-01-06 DIAGNOSIS — K7200 Acute and subacute hepatic failure without coma: Secondary | ICD-10-CM | POA: Diagnosis not present

## 2017-01-06 DIAGNOSIS — K7469 Other cirrhosis of liver: Secondary | ICD-10-CM

## 2017-01-06 DIAGNOSIS — Z66 Do not resuscitate: Secondary | ICD-10-CM | POA: Diagnosis not present

## 2017-01-06 DIAGNOSIS — K7290 Hepatic failure, unspecified without coma: Secondary | ICD-10-CM | POA: Diagnosis present

## 2017-01-06 DIAGNOSIS — K7460 Unspecified cirrhosis of liver: Secondary | ICD-10-CM | POA: Diagnosis not present

## 2017-01-06 DIAGNOSIS — E7220 Disorder of urea cycle metabolism, unspecified: Secondary | ICD-10-CM | POA: Diagnosis not present

## 2017-01-06 DIAGNOSIS — I959 Hypotension, unspecified: Secondary | ICD-10-CM | POA: Diagnosis present

## 2017-01-06 DIAGNOSIS — Z794 Long term (current) use of insulin: Secondary | ICD-10-CM | POA: Diagnosis not present

## 2017-01-06 DIAGNOSIS — K7682 Hepatic encephalopathy: Secondary | ICD-10-CM | POA: Diagnosis present

## 2017-01-06 DIAGNOSIS — R404 Transient alteration of awareness: Secondary | ICD-10-CM | POA: Diagnosis not present

## 2017-01-06 DIAGNOSIS — R531 Weakness: Secondary | ICD-10-CM | POA: Diagnosis not present

## 2017-01-06 DIAGNOSIS — E039 Hypothyroidism, unspecified: Secondary | ICD-10-CM | POA: Diagnosis present

## 2017-01-06 DIAGNOSIS — R410 Disorientation, unspecified: Secondary | ICD-10-CM

## 2017-01-06 DIAGNOSIS — K7030 Alcoholic cirrhosis of liver without ascites: Secondary | ICD-10-CM | POA: Diagnosis not present

## 2017-01-06 DIAGNOSIS — R001 Bradycardia, unspecified: Secondary | ICD-10-CM | POA: Diagnosis not present

## 2017-01-06 DIAGNOSIS — D6959 Other secondary thrombocytopenia: Secondary | ICD-10-CM | POA: Diagnosis present

## 2017-01-06 DIAGNOSIS — R4182 Altered mental status, unspecified: Secondary | ICD-10-CM | POA: Diagnosis not present

## 2017-01-06 LAB — COMPREHENSIVE METABOLIC PANEL
ALK PHOS: 51 U/L (ref 38–126)
ALT: 42 U/L (ref 14–54)
AST: 57 U/L — AB (ref 15–41)
Albumin: 2.8 g/dL — ABNORMAL LOW (ref 3.5–5.0)
Anion gap: 7 (ref 5–15)
BILIRUBIN TOTAL: 1.6 mg/dL — AB (ref 0.3–1.2)
BUN: 7 mg/dL (ref 6–20)
CO2: 28 mmol/L (ref 22–32)
CREATININE: 0.67 mg/dL (ref 0.44–1.00)
Calcium: 8 mg/dL — ABNORMAL LOW (ref 8.9–10.3)
Chloride: 101 mmol/L (ref 101–111)
GFR calc Af Amer: >60 mL/min (ref >60)
Glucose, Bld: 271 mg/dL — ABNORMAL HIGH (ref 65–99)
Potassium: 3.5 mmol/L (ref 3.5–5.1)
Sodium: 136 mmol/L (ref 135–145)
TOTAL PROTEIN: 5.8 g/dL — AB (ref 6.5–8.1)

## 2017-01-06 LAB — RAPID URINE DRUG SCREEN, HOSP PERFORMED
Amphetamines: NOT DETECTED
Barbiturates: NOT DETECTED
Benzodiazepines: NOT DETECTED
Cocaine: NOT DETECTED
OPIATES: NOT DETECTED
Tetrahydrocannabinol: NOT DETECTED

## 2017-01-06 LAB — URINALYSIS, ROUTINE W REFLEX MICROSCOPIC
Bilirubin Urine: NEGATIVE
Glucose, UA: >=500 mg/dL — AB
Hgb urine dipstick: NEGATIVE
KETONES UR: NEGATIVE mg/dL
LEUKOCYTES UA: NEGATIVE
NITRITE: NEGATIVE
PH: 7 (ref 5.0–8.0)
Protein, ur: NEGATIVE mg/dL
SPECIFIC GRAVITY, URINE: 1.015 (ref 1.005–1.030)

## 2017-01-06 LAB — CBC
HEMATOCRIT: 37.2 % (ref 36.0–46.0)
HEMOGLOBIN: 12.6 g/dL (ref 12.0–15.0)
MCH: 30.5 pg (ref 26.0–34.0)
MCHC: 33.9 g/dL (ref 30.0–36.0)
MCV: 90.1 fL (ref 78.0–100.0)
Platelets: 81 10*3/uL — ABNORMAL LOW (ref 150–400)
RBC: 4.13 MIL/uL (ref 3.87–5.11)
RDW: 16.1 % — ABNORMAL HIGH (ref 11.5–15.5)
WBC: 3.3 10*3/uL — AB (ref 4.0–10.5)

## 2017-01-06 LAB — URINALYSIS, MICROSCOPIC (REFLEX): RBC / HPF: NONE SEEN RBC/hpf (ref 0–5)

## 2017-01-06 LAB — DIFFERENTIAL
BASOS ABS: 0 10*3/uL (ref 0.0–0.1)
Basophils Relative: 1 %
Eosinophils Absolute: 0.1 10*3/uL (ref 0.0–0.7)
Eosinophils Relative: 3 %
LYMPHS ABS: 1.2 10*3/uL (ref 0.7–4.0)
Lymphocytes Relative: 36 %
MONOS PCT: 9 %
Monocytes Absolute: 0.3 10*3/uL (ref 0.1–1.0)
NEUTROS ABS: 1.7 10*3/uL (ref 1.7–7.7)
Neutrophils Relative %: 51 %

## 2017-01-06 LAB — PROTIME-INR
INR: 1.36
PROTHROMBIN TIME: 16.9 s — AB (ref 11.4–15.2)

## 2017-01-06 LAB — GLUCOSE, CAPILLARY
GLUCOSE-CAPILLARY: 301 mg/dL — AB (ref 65–99)
Glucose-Capillary: 298 mg/dL — ABNORMAL HIGH (ref 65–99)

## 2017-01-06 LAB — APTT: APTT: 40 s — AB (ref 24–36)

## 2017-01-06 LAB — ETHANOL: Alcohol, Ethyl (B): <5 mg/dL (ref <5)

## 2017-01-06 LAB — AMMONIA: AMMONIA: 96 umol/L — AB (ref 9–35)

## 2017-01-06 LAB — CBG MONITORING, ED: Glucose-Capillary: 280 mg/dL — ABNORMAL HIGH (ref 65–99)

## 2017-01-06 MED ORDER — SODIUM CHLORIDE 0.9 % IV BOLUS (SEPSIS)
500.0000 mL | Freq: Once | INTRAVENOUS | Status: AC
  Administered 2017-01-06: 500 mL via INTRAVENOUS

## 2017-01-06 MED ORDER — ZOLPIDEM TARTRATE 5 MG PO TABS
5.0000 mg | ORAL_TABLET | Freq: Once | ORAL | Status: AC
  Administered 2017-01-06: 5 mg via ORAL
  Filled 2017-01-06: qty 1

## 2017-01-06 MED ORDER — INSULIN ASPART 100 UNIT/ML ~~LOC~~ SOLN
0.0000 [IU] | Freq: Three times a day (TID) | SUBCUTANEOUS | Status: DC
  Administered 2017-01-06: 5 [IU] via SUBCUTANEOUS
  Administered 2017-01-07: 9 [IU] via SUBCUTANEOUS
  Administered 2017-01-07 (×2): 3 [IU] via SUBCUTANEOUS
  Administered 2017-01-08: 5 [IU] via SUBCUTANEOUS
  Administered 2017-01-08: 3 [IU] via SUBCUTANEOUS
  Administered 2017-01-09: 9 [IU] via SUBCUTANEOUS
  Administered 2017-01-09: 7 [IU] via SUBCUTANEOUS
  Administered 2017-01-10: 3 [IU] via SUBCUTANEOUS
  Administered 2017-01-10: 5 [IU] via SUBCUTANEOUS
  Administered 2017-01-11: 1 [IU] via SUBCUTANEOUS
  Administered 2017-01-11: 7 [IU] via SUBCUTANEOUS
  Administered 2017-01-11 – 2017-01-12 (×2): 9 [IU] via SUBCUTANEOUS
  Administered 2017-01-12: 2 [IU] via SUBCUTANEOUS

## 2017-01-06 MED ORDER — PROPRANOLOL HCL 10 MG PO TABS
10.0000 mg | ORAL_TABLET | Freq: Every day | ORAL | Status: DC
  Administered 2017-01-07 – 2017-01-12 (×5): 10 mg via ORAL
  Filled 2017-01-06 (×6): qty 1

## 2017-01-06 MED ORDER — SODIUM CHLORIDE 0.9% FLUSH
3.0000 mL | Freq: Two times a day (BID) | INTRAVENOUS | Status: DC
Start: 2017-01-06 — End: 2017-01-12
  Administered 2017-01-06 – 2017-01-12 (×12): 3 mL via INTRAVENOUS

## 2017-01-06 MED ORDER — LACTULOSE 10 GM/15ML PO SOLN
20.0000 g | Freq: Two times a day (BID) | ORAL | Status: DC
  Administered 2017-01-06 – 2017-01-08 (×4): 20 g via ORAL
  Filled 2017-01-06 (×4): qty 30

## 2017-01-06 MED ORDER — INSULIN NPH (HUMAN) (ISOPHANE) 100 UNIT/ML ~~LOC~~ SUSP
20.0000 [IU] | Freq: Every day | SUBCUTANEOUS | Status: DC
  Administered 2017-01-06: 20 [IU] via SUBCUTANEOUS
  Filled 2017-01-06: qty 10

## 2017-01-06 MED ORDER — LEVOTHYROXINE SODIUM 112 MCG PO TABS
112.0000 ug | ORAL_TABLET | Freq: Every day | ORAL | Status: DC
  Administered 2017-01-07 – 2017-01-12 (×6): 112 ug via ORAL
  Filled 2017-01-06 (×6): qty 1

## 2017-01-06 MED ORDER — LACTULOSE 10 GM/15ML PO SOLN
30.0000 g | Freq: Once | ORAL | Status: AC
  Administered 2017-01-06: 30 g via ORAL
  Filled 2017-01-06: qty 60

## 2017-01-06 MED ORDER — LISINOPRIL 5 MG PO TABS
5.0000 mg | ORAL_TABLET | Freq: Every day | ORAL | Status: DC
  Administered 2017-01-07 – 2017-01-10 (×4): 5 mg via ORAL
  Filled 2017-01-06 (×5): qty 1

## 2017-01-06 MED ORDER — INSULIN ASPART 100 UNIT/ML ~~LOC~~ SOLN
0.0000 [IU] | Freq: Every day | SUBCUTANEOUS | Status: DC
  Administered 2017-01-06: 3 [IU] via SUBCUTANEOUS
  Administered 2017-01-07: 2 [IU] via SUBCUTANEOUS
  Administered 2017-01-08 – 2017-01-11 (×4): 3 [IU] via SUBCUTANEOUS

## 2017-01-06 NOTE — H&P (Signed)
History and Physical    Kimberly [name] ZOX:[id number] DOB: 14-Aug-1940 DOA: 01/06/2017  PCP: Patient, No Pcp Per Patient coming from: Home via EMS  Chief Complaint: AMS  HPI: Kimberly [name] is a 76 y.o. female with medical history significant of non-alcoholic cirrhosis, diabetes mellitus, hypertension, thrombocytopenia, portal hypertension, esophageal varicies, hypothyroisism. Patient presented to the ED secondary to altered mental status. Patient does not know why she is in the hospital secondary to acute mental status change. Per daughter, this morning, the patient was acting more confused than usual. She has had a history of memory loss recently, for which she is setting up follow-up with neurology as an outpatient. Lately, the patient's daughter has been giving lactulose 15ml multiple times per day to treat acute mental status changes from hepatic encephalopathy, which usually works, but not today.  ED Course: Vitals: Afebrile, pulse in 40-50s, respiratory rate 14, BP normotensive, O2 94% on room air Labs: Ammonia of 96, AST of 57, Bili of 1.6 Imaging: CT head without acute process. CXR significant for atelectasis vs bronchopneumonia of medial right lower lobe Medications/Course: lactulose orally  Review of Systems: Review of Systems  Constitutional: Negative for chills and fever.  Respiratory: Negative for cough, hemoptysis and shortness of breath.   Cardiovascular: Negative for chest pain.  Gastrointestinal: Negative for abdominal pain, blood in stool, constipation, diarrhea, melena, nausea and vomiting.  Genitourinary: Negative for dysuria.  Neurological:       Confusion  Psychiatric/Behavioral: Positive for memory loss.  All other systems reviewed and are negative.   Past Medical History:  Diagnosis Date  . Diabetes mellitus without complication (HCC)   . Hypertension     Past Surgical History:  Procedure Laterality Date  . ABDOMINAL HYSTERECTOMY    . APPENDECTOMY    .  CHOLECYSTECTOMY       reports that she has quit smoking. She has never used smokeless tobacco. She reports that she does not drink alcohol or use drugs.  Allergies  Allergen Reactions  . Aspirin Other (See Comments)  . Atorvastatin     Other reaction(s): MYALGIA  . Codeine     nausea  . Levothyroxine     Other reaction(s): PALPITATIONS  . Pravastatin Other (See Comments)  . Propoxyphene     Family History  Problem Relation Age of Onset  . Cirrhosis Neg Hx    Prior to Admission medications   Medication Sig Start Date End Date Taking? Authorizing Provider  furosemide (LASIX) 20 MG tablet furosemide 20 mg tablet    [provider]  HYDROcodone-acetaminophen (NORCO) 5-325 MG tablet Take 1-2 tablets by mouth every 6 (six) hours as needed. 12/11/15   Geoffery Lyons, MD  insulin aspart (NOVOLOG) 100 unit/mL injection Inject 10 Units into the skin 3 (three) times daily before meals.    [provider]  Insulin Detemir (LEVEMIR) 100 UNIT/ML Pen Inject into the skin daily at 10 pm.    [provider]  LISINOPRIL PO Take by mouth.    [provider]  Propranolol HCl (INDERAL PO) Take by mouth.    [provider]  TRAZODONE HCL PO Take by mouth.    [provider]    Physical Exam: Vitals:   01/06/17 1101 01/06/17 1205 01/06/17 1249  BP: 124/69 132/71   Pulse: (!) 47 (!) 53   Resp: 12 14   Temp: 97.9 F (36.6 C)  97.9 F (36.6 C)  TempSrc: Oral    SpO2: 93% 94%   Weight: 68 kg (  150 lb)    Height: 5\' 3"  (1.6 m)       Constitutional: NAD, calm, comfortable Eyes: PERRL, lids and conjunctivae normal ENMT: Mucous membranes are moist. Posterior pharynx clear of any exudate or lesions. Poor dentition with some missing teeth Neck: normal, supple, no masses, no thyromegaly Respiratory: clear to auscultation bilaterally but difficult to appreciate secondary to murmur, no wheezing, no crackles. Normal respiratory effort. No accessory  muscle use.  Cardiovascular: Regular rate and rhythm, 3/6 crescendo-decrescendo murmur. No extremity edema. 2+ pedal pulses. No carotid bruits.  Abdomen: no tenderness, no masses palpated. Bowel sounds positive.  Musculoskeletal: no clubbing / cyanosis. No joint deformity upper and lower extremities. Good ROM, no contractures. Normal muscle tone.  Skin: no rashes, lesions, ulcers. No induration Neurologic: CN 2-12 grossly intact. Sensation intact, DTR normal. Strength 4/5 in all 4.  Psychiatric: Normal judgment and impaired. Alert and oriented to person. Normal mood.   Labs on Admission: I have personally reviewed following labs and imaging studies  CBC:  Recent Labs Lab 01/06/17 1154  WBC 3.3*  NEUTROABS 1.7  HGB 12.6  HCT 37.2  MCV 90.1  PLT 81*   Basic Metabolic Panel:  Recent Labs Lab 01/06/17 1154  NA 136  K 3.5  CL 101  CO2 28  GLUCOSE 271*  BUN 7  CREATININE 0.67  CALCIUM 8.0*   GFR: Estimated Creatinine Clearance: 57.1 mL/min (by C-G formula based on SCr of 0.67 mg/dL). Liver Function Tests:  Recent Labs Lab 01/06/17 1154  AST 57*  ALT 42  ALKPHOS 51  BILITOT 1.6*  PROT 5.8*  ALBUMIN 2.8*   No results for input(s): LIPASE, AMYLASE in the last 168 hours.  Recent Labs Lab 01/06/17 1154  AMMONIA 96*   Coagulation Profile:  Recent Labs Lab 01/06/17 1154  INR 1.36   Cardiac Enzymes: No results for input(s): CKTOTAL, CKMB, CKMBINDEX, TROPONINI in the last 168 hours. BNP (last 3 results) No results for input(s): PROBNP in the last 8760 hours. HbA1C: No results for input(s): HGBA1C in the last 72 hours. CBG:  Recent Labs Lab 01/06/17 1059  GLUCAP 280*   Lipid Profile: No results for input(s): CHOL, HDL, LDLCALC, TRIG, CHOLHDL, LDLDIRECT in the last 72 hours. Thyroid Function Tests: No results for input(s): TSH, T4TOTAL, FREET4, T3FREE, THYROIDAB in the last 72 hours. Anemia Panel: No results for input(s): VITAMINB12, FOLATE, FERRITIN,  TIBC, IRON, RETICCTPCT in the last 72 hours. Urine analysis: No results found for: COLORURINE, APPEARANCEUR, LABSPEC, PHURINE, GLUCOSEU, HGBUR, BILIRUBINUR, KETONESUR, PROTEINUR, UROBILINOGEN, NITRITE, LEUKOCYTESUR Sepsis Labs: !!!!!!!!!!!!!!!!!!!!!!!!!!!!!!!!!!!!!!!!!!!! @LABRCNTIP (procalcitonin:4,lacticidven:4) )No results found for this or any previous visit (from the past 240 hour(s)).   Radiological Exams on Admission: Dg Chest 2 View  Result Date: 01/06/2017 CLINICAL DATA:  76 year old presenting with acute confusion. Current history of diabetes and hypertension. Former smoker. EXAM: CHEST  2 VIEW COMPARISON:  04/21/2016. FINDINGS: AP semi-erect and lateral images were obtained. Suboptimal inspiration accounts for crowded bronchovascular markings, especially in the bases, and accentuates the cardiac silhouette. Taking this into account, cardiac silhouette upper normal in size, unchanged. Thoracic aorta atherosclerotic, unchanged. Hilar and mediastinal contours otherwise unremarkable. Streaky and patchy opacities in the medial right lower lobe. Lungs otherwise clear. No pleural effusions. No pneumothorax. Normal pulmonary vascularity. Likely old healed mildly impacted fracture of the left humeral neck. Degenerative changes involving the thoracic spine. IMPRESSION: Suboptimal inspiration. Atelectasis versus bronchopneumonia involving the medial right lower lobe. Electronically Signed   By: Hulan Saas M.D.   On:  01/06/2017 11:50   Ct Head Wo Contrast  Result Date: 01/06/2017 CLINICAL DATA:  Altered mental status/confusion EXAM: CT HEAD WITHOUT CONTRAST TECHNIQUE: Contiguous axial images were obtained from the base of the skull through the vertex without intravenous contrast. COMPARISON:  None. FINDINGS: Brain: There is age related volume loss. There is no intracranial mass, hemorrhage, extra-axial fluid collection, or midline shift. There is patchy small vessel disease in the centra semiovale  bilaterally. Elsewhere gray-white compartments appear normal. There is no evident acute infarct. Vascular: There is no hyperdense vessel appreciable. There are foci of calcification in each carotid siphon region. Skull: Bony calvarium appears intact. Sinuses/Orbits: There is a small benign osteoma in the posterior right ethmoid air cell complex medially. There is slight mucosal thickening of several ethmoid air cells. Visualized paranasal sinuses elsewhere are clear. Orbits appear symmetric bilaterally. Other: Mastoid air cells are clear. IMPRESSION: Age related volume loss with patchy periventricular small vessel disease. No intracranial mass, hemorrhage, extra-axial fluid collection. No acute appearing infarct. Foci of calcification in the carotid siphon regions. There is mild ethmoid sinus disease bilaterally. Electronically Signed   By: Bretta Bang III M.D.   On: 01/06/2017 11:46    EKG: Independently reviewed. Bradycardia. T wave flattening in lateral leads  Assessment/Plan Principal Problem:   Acute hepatic encephalopathy Active Problems:   Cirrhosis (HCC)   Uncontrolled type 2 diabetes mellitus with hyperglycemia (HCC)   Portal hypertension (HCC)   Hyperbilirubinemia   Esophageal varices (HCC)   Atelectasis   Essential hypertension   Hypothyroidism, unspecified   Altered mental status Likely acute hepatic encephalopathy. Ammonia of 96. Patient has been taking her lactulose per daughter's report. Per chart review, it appears lactulose was either discontinued or dose increased. Daughter has been giving at least 10g twice daily. Mental status already improved closer to baseline by the time I interviewed the patient. -lactulose 20g BID -PT/OT  Cirrhosis Portal hypertension Hyperbilirubinemia Esophageal varicies Sees [facility] as an outpatient. Takes propranolol 20mg  daily -reduce propranolol to 10mg  -lactulose as above  Insulin dependent diabetes mellitus Hemoglobin A1C of 10.3 on  12/12/16. 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 Stable.  Hypothyroidism -continue Synthroid daily   DVT prophylaxis: SCDs secondary to thromboyctopenia Code Status: DNR Family Communication: Daughter at bedside Disposition Plan: Anticipate discharge home tomorrow Consults called: None Admission status: Observation, telemetry   Kimberly Hawking, MD [facility] Pager [phone]  If 7PM-7AM, please contact night-coverage [email or web address] Password [email or web address]  01/06/2017, 1:38 PM

## 2017-01-06 NOTE — ED Notes (Signed)
Bed: WA12 Expected date:  Expected time:  Means of arrival:  Comments: EMS/ weak 

## 2017-01-06 NOTE — ED Provider Notes (Signed)
[facility]-EMERGENCY DEPT Provider Note   CSN: [id number] Arrival date & time: 01/06/17  1044     History   Chief Complaint Chief Complaint  Patient presents with  . Weakness    HPI Kimberly [name] is a 76 y.o. female.  HPI The patient presents to the emergency room for evaluation of confusion. According to the EMS report the patient has history of cirrhosis. She takes lactulose for this. The family members noted the patient was more confused than usual. They called EMS. Patient herself denies any complaints.  She denies any trouble with abdominal pain, chest pain, fevers, chills, vomiting or diarrhea. She is not sure why she is here in the emergency room In the emergency room the patient was noted, urine and some stool. Past Medical History:  Diagnosis Date  . Diabetes mellitus without complication (HCC)   . Hypertension     There are no active problems to display for this patient.   Past Surgical History:  Procedure Laterality Date  . ABDOMINAL HYSTERECTOMY    . APPENDECTOMY    . CHOLECYSTECTOMY      OB History    No data available       Home Medications    Prior to Admission medications   Medication Sig Start Date End Date Taking? Authorizing Provider  furosemide (LASIX) 20 MG tablet furosemide 20 mg tablet    [provider]  HYDROcodone-acetaminophen (NORCO) 5-325 MG tablet Take 1-2 tablets by mouth every 6 (six) hours as needed. 12/11/15   Geoffery Lyons, MD  insulin aspart (NOVOLOG) 100 unit/mL injection Inject 10 Units into the skin 3 (three) times daily before meals.    [provider]  Insulin Detemir (LEVEMIR) 100 UNIT/ML Pen Inject into the skin daily at 10 pm.    [provider]  LISINOPRIL PO Take by mouth.    [provider]  Propranolol HCl (INDERAL PO) Take by mouth.    [provider]  TRAZODONE HCL PO Take by mouth.    [provider]    Family History History reviewed. No pertinent family  history.  Social History Social History  Substance Use Topics  . Smoking status: Former Games developer  . Smokeless tobacco: Never Used  . Alcohol use No     Allergies   Aspirin; Atorvastatin; Codeine; Levothyroxine; Pravastatin; and Propoxyphene   Review of Systems Review of Systems  All other systems reviewed and are negative.    Physical Exam Updated Vital Signs BP 132/71   Pulse (!) 53   Temp 97.9 F (36.6 C)   Resp 14   Ht 1.6 m (5\' 3" )   Wt 68 kg (150 lb)   SpO2 94%   BMI 26.57 kg/m   Physical Exam  Constitutional: No distress.  HENT:  Head: Normocephalic and atraumatic.  Right Ear: External ear normal.  Left Ear: External ear normal.  Eyes: Conjunctivae are normal. Right eye exhibits no discharge. Left eye exhibits no discharge. No scleral icterus.  Neck: Neck supple. No tracheal deviation present.  Cardiovascular: Normal rate, regular rhythm and intact distal pulses.   Murmur (systolic) heard. Pulmonary/Chest: Effort normal and breath sounds normal. No stridor. No respiratory distress. She has no wheezes. She has no rales.  Abdominal: Soft. Bowel sounds are normal. She exhibits no distension. There is no tenderness. There is no rebound and no guarding.  Musculoskeletal: She exhibits edema (trace edema of the lower extremities). She exhibits no tenderness.  Neurological: She is alert. She displays no tremor. No  cranial nerve deficit (no facial droop, extraocular movements intact, no slurred speech) or sensory deficit. She exhibits normal muscle tone. She displays no seizure activity.  Patient is oriented to person place and time, she is somewhat slow to answer questions but does answer questions appropriately and follows commands, she moves all extremities equally, generalized weakness  Skin: Skin is warm and dry. No rash noted. She is not diaphoretic.  Psychiatric: She has a normal mood and affect.  Nursing note and vitals reviewed.    ED Treatments / Results   Labs (all labs ordered are listed, but only abnormal results are displayed) Labs Reviewed  PROTIME-INR - Abnormal; Notable for the following:       Result Value   Prothrombin Time 16.9 (*)    All other components within normal limits  APTT - Abnormal; Notable for the following:    aPTT 40 (*)    All other components within normal limits  CBC - Abnormal; Notable for the following:    WBC 3.3 (*)    RDW 16.1 (*)    Platelets 81 (*)    All other components within normal limits  COMPREHENSIVE METABOLIC PANEL - Abnormal; Notable for the following:    Glucose, Bld 271 (*)    Calcium 8.0 (*)    Total Protein 5.8 (*)    Albumin 2.8 (*)    AST 57 (*)    Total Bilirubin 1.6 (*)    All other components within normal limits  AMMONIA - Abnormal; Notable for the following:    Ammonia 96 (*)    All other components within normal limits  CBG MONITORING, ED - Abnormal; Notable for the following:    Glucose-Capillary 280 (*)    All other components within normal limits  ETHANOL  DIFFERENTIAL  RAPID URINE DRUG SCREEN, HOSP PERFORMED  URINALYSIS, ROUTINE W REFLEX MICROSCOPIC    EKG  EKG Interpretation  Date/Time:  Tuesday January 06 2017 10:56:10 EDT Ventricular Rate:  54 PR Interval:    QRS Duration: 81 QT Interval:  502 QTC Calculation: 476 R Axis:   -13 Text Interpretation:  Sinus rhythm Anteroseptal infarct, old Borderline repolarization abnormality Baseline wander in lead(s) II aVR Since last tracing rate slower diffuse t wave changes since last tracing Confirmed by Linwood Dibbles [phone]) on 01/06/2017 11:20:17 AM       Radiology Dg Chest 2 View  Result Date: 01/06/2017 CLINICAL DATA:  76 year old presenting with acute confusion. Current history of diabetes and hypertension. Former smoker. EXAM: CHEST  2 VIEW COMPARISON:  04/21/2016. FINDINGS: AP semi-erect and lateral images were obtained. Suboptimal inspiration accounts for crowded bronchovascular markings, especially in the bases,  and accentuates the cardiac silhouette. Taking this into account, cardiac silhouette upper normal in size, unchanged. Thoracic aorta atherosclerotic, unchanged. Hilar and mediastinal contours otherwise unremarkable. Streaky and patchy opacities in the medial right lower lobe. Lungs otherwise clear. No pleural effusions. No pneumothorax. Normal pulmonary vascularity. Likely old healed mildly impacted fracture of the left humeral neck. Degenerative changes involving the thoracic spine. IMPRESSION: Suboptimal inspiration. Atelectasis versus bronchopneumonia involving the medial right lower lobe. Electronically Signed   By: Hulan Saas M.D.   On: 01/06/2017 11:50   Ct Head Wo Contrast  Result Date: 01/06/2017 CLINICAL DATA:  Altered mental status/confusion EXAM: CT HEAD WITHOUT CONTRAST TECHNIQUE: Contiguous axial images were obtained from the base of the skull through the vertex without intravenous contrast. COMPARISON:  None. FINDINGS: Brain: There is age related volume loss. There is no  intracranial mass, hemorrhage, extra-axial fluid collection, or midline shift. There is patchy small vessel disease in the centra semiovale bilaterally. Elsewhere gray-white compartments appear normal. There is no evident acute infarct. Vascular: There is no hyperdense vessel appreciable. There are foci of calcification in each carotid siphon region. Skull: Bony calvarium appears intact. Sinuses/Orbits: There is a small benign osteoma in the posterior right ethmoid air cell complex medially. There is slight mucosal thickening of several ethmoid air cells. Visualized paranasal sinuses elsewhere are clear. Orbits appear symmetric bilaterally. Other: Mastoid air cells are clear. IMPRESSION: Age related volume loss with patchy periventricular small vessel disease. No intracranial mass, hemorrhage, extra-axial fluid collection. No acute appearing infarct. Foci of calcification in the carotid siphon regions. There is mild ethmoid  sinus disease bilaterally. Electronically Signed   By: Bretta Bang III M.D.   On: 01/06/2017 11:46    Procedures Procedures (including critical care time)  Medications Ordered in ED Medications  sodium chloride 0.9 % bolus 500 mL (500 mLs Intravenous New Bag/Given 01/06/17 1203)     Initial Impression / Assessment and Plan / ED Course  I have reviewed the triage vital signs and the nursing notes.  Pertinent labs & imaging results that were available during my care of the patient were reviewed by me and considered in my medical decision making (see chart for details).   Pt presents to the ED for confusion.  No family members are present in the ED to provide additional information.  Pt is alert and oriented but she does seem listless and is emotionally labile.  I suspect this is related to her ammonia levels.  UA is pending. CXR with atelectasis vs pnemonia.  No history of cough, no fever.  Suspect atelectasis. No acute findings on head CT.  I will order lactulose.  Consult with medical service for admission  Final Clinical Impressions(s) / ED Diagnoses   Final diagnoses:  Hyperammonemia (HCC)  Confusion    New Prescriptions New Prescriptions   No medications on file     Linwood Dibbles, MD 01/06/17 1335

## 2017-01-06 NOTE — ED Triage Notes (Signed)
Per EMS, pt from home.  Pt has hx of cirrhosis of liver.  Pt takes lactulose.  Pt ammonia gets abnormal.  ? UTI d/t odor.  Pt slightly altered mental.  A/O x 3.  Weakness starting today.  cbg 292.  WNL for patient.  Vitals:  126/79, hr 62, 95% ra, resp 18

## 2017-01-06 NOTE — ED Notes (Addendum)
Patient transported to X-ray and CT. Will obtain labs when pt returns.

## 2017-01-07 DIAGNOSIS — K7290 Hepatic failure, unspecified without coma: Secondary | ICD-10-CM | POA: Diagnosis not present

## 2017-01-07 DIAGNOSIS — K7030 Alcoholic cirrhosis of liver without ascites: Secondary | ICD-10-CM

## 2017-01-07 DIAGNOSIS — K766 Portal hypertension: Secondary | ICD-10-CM | POA: Diagnosis not present

## 2017-01-07 DIAGNOSIS — E7220 Disorder of urea cycle metabolism, unspecified: Secondary | ICD-10-CM | POA: Diagnosis not present

## 2017-01-07 DIAGNOSIS — R410 Disorientation, unspecified: Secondary | ICD-10-CM | POA: Diagnosis not present

## 2017-01-07 DIAGNOSIS — K7469 Other cirrhosis of liver: Secondary | ICD-10-CM | POA: Diagnosis not present

## 2017-01-07 DIAGNOSIS — K7200 Acute and subacute hepatic failure without coma: Secondary | ICD-10-CM | POA: Diagnosis not present

## 2017-01-07 DIAGNOSIS — J9811 Atelectasis: Secondary | ICD-10-CM | POA: Diagnosis not present

## 2017-01-07 DIAGNOSIS — I10 Essential (primary) hypertension: Secondary | ICD-10-CM | POA: Diagnosis not present

## 2017-01-07 LAB — CBC
HCT: 41.1 % (ref 36.0–46.0)
Hemoglobin: 14.1 g/dL (ref 12.0–15.0)
MCH: 31.1 pg (ref 26.0–34.0)
MCHC: 34.3 g/dL (ref 30.0–36.0)
MCV: 90.5 fL (ref 78.0–100.0)
PLATELETS: 80 10*3/uL — AB (ref 150–400)
RBC: 4.54 MIL/uL (ref 3.87–5.11)
RDW: 16.2 % — ABNORMAL HIGH (ref 11.5–15.5)
WBC: 4 10*3/uL (ref 4.0–10.5)

## 2017-01-07 LAB — COMPREHENSIVE METABOLIC PANEL
ALT: 43 U/L (ref 14–54)
ANION GAP: 6 (ref 5–15)
AST: 62 U/L — ABNORMAL HIGH (ref 15–41)
Albumin: 2.9 g/dL — ABNORMAL LOW (ref 3.5–5.0)
Alkaline Phosphatase: 49 U/L (ref 38–126)
BUN: 6 mg/dL (ref 6–20)
CHLORIDE: 106 mmol/L (ref 101–111)
CO2: 27 mmol/L (ref 22–32)
CREATININE: 0.46 mg/dL (ref 0.44–1.00)
Calcium: 8.3 mg/dL — ABNORMAL LOW (ref 8.9–10.3)
Glucose, Bld: 224 mg/dL — ABNORMAL HIGH (ref 65–99)
POTASSIUM: 3.4 mmol/L — AB (ref 3.5–5.1)
Sodium: 139 mmol/L (ref 135–145)
Total Bilirubin: 2 mg/dL — ABNORMAL HIGH (ref 0.3–1.2)
Total Protein: 6.2 g/dL — ABNORMAL LOW (ref 6.5–8.1)

## 2017-01-07 LAB — GLUCOSE, CAPILLARY
Glucose-Capillary: 224 mg/dL — ABNORMAL HIGH (ref 65–99)
Glucose-Capillary: 351 mg/dL — ABNORMAL HIGH (ref 65–99)
Glucose-Capillary: 237 mg/dL — ABNORMAL HIGH (ref 65–99)
Glucose-Capillary: 218 mg/dL — ABNORMAL HIGH (ref 65–99)

## 2017-01-07 MED ORDER — INSULIN NPH (HUMAN) (ISOPHANE) 100 UNIT/ML ~~LOC~~ SUSP
20.0000 [IU] | Freq: Two times a day (BID) | SUBCUTANEOUS | Status: DC
  Administered 2017-01-07 – 2017-01-09 (×5): 20 [IU] via SUBCUTANEOUS

## 2017-01-07 MED ORDER — INSULIN ASPART 100 UNIT/ML ~~LOC~~ SOLN
5.0000 [IU] | Freq: Three times a day (TID) | SUBCUTANEOUS | Status: DC
  Administered 2017-01-07 – 2017-01-12 (×11): 5 [IU] via SUBCUTANEOUS

## 2017-01-07 NOTE — Evaluation (Signed)
Physical Therapy Evaluation Patient Details Name: Kimberly [name] MRN: [id number] DOB: 02/05/1941 Today's Date: 01/07/2017   History of Present Illness   76 y.o. female with medical history significant of non-alcoholic cirrhosis, diabetes mellitus, hypertension, thrombocytopenia, portal hypertension, esophageal varicies, hypothyroisism. Patient presented to the ED secondary to altered mental status.  Clinical Impression  Pt admitted with above diagnosis. Pt currently with functional limitations due to the deficits listed below (see PT Problem List). Pt ambulated 150' with RW and min A for balance. Supervision for mobility recommended as pt is at risk for falls.  Pt will benefit from skilled PT to increase their independence and safety with mobility to allow discharge to the venue listed below.       Follow Up Recommendations Supervision for mobility/OOB;Home health PT    Equipment Recommendations  None recommended by PT    Recommendations for Other Services       Precautions / Restrictions Precautions Precautions: Fall Precaution Comments: reports several falls in past year, can't recall how many Restrictions Weight Bearing Restrictions: No      Mobility  Bed Mobility Overal bed mobility: Independent                Transfers Overall transfer level: Needs assistance Equipment used: Rolling walker (2 wheeled) Transfers: Sit to/from Stand Sit to Stand: Min guard         General transfer comment: VCs hand placement, uncontrolled descent to recliner  Ambulation/Gait Ambulation/Gait assistance: Min assist Ambulation Distance (Feet): 150 Feet Assistive device: Rolling walker (2 wheeled) Gait Pattern/deviations: Decreased step length - right;Decreased step length - left;Drifts right/left Gait velocity: decr Gait velocity interpretation: Below normal speed for age/gender General Gait Details: min A for balance esp with turns, min A to steer RW  Stairs             Wheelchair Mobility    Modified Rankin (Stroke Patients Only)       Balance Overall balance assessment: Needs assistance   Sitting balance-Leahy Scale: Good       Standing balance-Leahy Scale: Fair                               Pertinent Vitals/Pain Pain Assessment: No/denies pain    Home Living Family/patient expects to be discharged to:: Private residence Living Arrangements: Children Available Help at Discharge: Family;Available PRN/intermittently Type of Home: Apartment Home Access: Stairs to enter   Entrance Stairs-Number of Steps: 16 Home Layout: One level Home Equipment: Walker - 2 wheels;Cane - single point;Hand held shower head;Shower seat Additional Comments: lives with daughter who works days    Prior Function Level of Independence: Independent with assistive device(s)         Comments: walks with RW or SPC     Hand Dominance        Extremity/Trunk Assessment   Upper Extremity Assessment Upper Extremity Assessment: Overall WFL for tasks assessed    Lower Extremity Assessment Lower Extremity Assessment: Overall WFL for tasks assessed    Cervical / Trunk Assessment Cervical / Trunk Assessment: Normal  Communication   Communication: No difficulties  Cognition Arousal/Alertness: Awake/alert Behavior During Therapy: WFL for tasks assessed/performed Overall Cognitive Status: No family/caregiver present to determine baseline cognitive functioning                                 General Comments: pt oriented to self,  location, month and year but increased time to answer questions, slow processing      General Comments      Exercises     Assessment/Plan    PT Assessment Patient needs continued PT services  PT Problem List Decreased activity tolerance;Decreased balance;Decreased mobility;Decreased cognition       PT Treatment Interventions Gait training;Functional mobility training;Balance  training;Therapeutic exercise;Patient/family education;Therapeutic activities    PT Goals (Current goals can be found in the Care Plan section)  Acute Rehab PT Goals Patient Stated Goal: likes to play games on her phone PT Goal Formulation: With patient Time For Goal Achievement: 01/21/17 Potential to Achieve Goals: Good    Frequency Min 3X/week   Barriers to discharge        Co-evaluation               AM-PAC PT "6 Clicks" Daily Activity  Outcome Measure Difficulty turning over in bed (including adjusting bedclothes, sheets and blankets)?: None Difficulty moving from lying on back to sitting on the side of the bed? : None Difficulty sitting down on and standing up from a chair with arms (e.g., wheelchair, bedside commode, etc,.)?: A Little Help needed moving to and from a bed to chair (including a wheelchair)?: A Little Help needed walking in hospital room?: A Little Help needed climbing 3-5 steps with a railing? : A Lot 6 Click Score: 19    End of Session Equipment Utilized During Treatment: Gait belt Activity Tolerance: Patient tolerated treatment well Patient left: in chair;with call bell/phone within reach;with chair alarm set Nurse Communication: Mobility status PT Visit Diagnosis: Unsteadiness on feet (R26.81);Difficulty in walking, not elsewhere classified (R26.2);History of falling (Z91.81)    Time: 9811-9147 PT Time Calculation (min) (ACUTE ONLY): 23 min   Charges:   PT Evaluation $PT Eval Low Complexity: 1 Procedure PT Treatments $Gait Training: 8-22 mins   PT G Codes:   PT G-Codes **NOT FOR INPATIENT CLASS** Functional Assessment Tool Used: AM-PAC 6 Clicks Basic Mobility Functional Limitation: Mobility: Walking and moving around Mobility: Walking and Moving Around Current Status (W2956): At least 20 percent but less than 40 percent impaired, limited or restricted Mobility: Walking and Moving Around Goal Status [phone]): At least 1 percent but less than  20 percent impaired, limited or restricted      Tamala Ser 01/07/2017, 10:07 AM [phone]

## 2017-01-07 NOTE — Evaluation (Signed)
Occupational Therapy Evaluation Patient Details Name: Kimberly [name] MRN: [id number] DOB: [birth year] Today's Date: 01/07/2017    History of Present Illness  76 y.o. female with medical history significant of non-alcoholic cirrhosis, diabetes mellitus, hypertension, thrombocytopenia, portal hypertension, esophageal varicies, hypothyroisism. Patient presented to the ED secondary to altered mental status.   Clinical Impression   Pt was admitted for the above. At baseline, she lives with daughter and stays alone during the day. She currently needs min A for ADLs and had one LOB during evaluation. She is unsafe to stay alone at this time due to both balance and cognition.  Pt is processing very slowly, is easily distractible, and needs cues for safety and problem solving. Recommend SNF unless family can arrange for 24/7 at discharge.      Follow Up Recommendations  SNF (vs 24/7 supervision and HHOT)    Equipment Recommendations   (to be further assessed)    Recommendations for Other Services       Precautions / Restrictions Precautions Precautions: Fall Restrictions Weight Bearing Restrictions: No      Mobility Bed Mobility              General bed mobility comments: OOB  Transfers Overall transfer level: Needs assistance Equipment used: Rolling walker (2 wheeled) Transfers: Sit to/from Stand Sit to Stand: Min guard         General transfer comment: for safety; multimodal cues for hand placement    Balance Overall balance assessment: Needs assistance   Sitting balance-Leahy Scale: Good       Standing balance-Leahy Scale: Poor (dynamic balance poor)                             ADL either performed or assessed with clinical judgement   ADL Overall ADL's : Needs assistance/impaired     Grooming: Oral care;Min guard;Standing   Upper Body Bathing: Set up;Sitting   Lower Body Bathing: Minimal assistance;Sit to/from stand   Upper Body Dressing :  Set up;Sitting   Lower Body Dressing: Minimal assistance;Sit to/from stand   Toilet Transfer: Minimal assistance;Ambulation;BSC;RW   Toileting- Architect and Hygiene: Min guard;Sit to/from stand         General ADL Comments: pt had one LOB when walking back from bathroom (posterior).  She needs multimodal cues at times to process and sequence what is said.       Vision         Perception     Praxis      Pertinent Vitals/Pain Pain Assessment: No/denies pain     Hand Dominance     Extremity/Trunk Assessment Upper Extremity Assessment Upper Extremity Assessment: Overall WFL for tasks assessed      Cervical / Trunk Assessment Cervical / Trunk Assessment: Normal   Communication Communication Communication:  (word finding difficulties; quiet)   Cognition Arousal/Alertness: Awake/alert Behavior During Therapy: WFL for tasks assessed/performed Overall Cognitive Status: Impaired/Different from baseline Area of Impairment: Attention;Following commands;Safety/judgement;Problem solving                   Current Attention Level: Sustained (easily distracted)   Following Commands: Follows one step commands with increased time Safety/Judgement: Decreased awareness of safety;Decreased awareness of deficits   Problem Solving: Slow processing;Requires verbal cues General Comments: pt admitted with encephalopathy.  Per chart, she was home alone during the day when daughter worked.     General Comments       Exercises  Shoulder Instructions      Home Living Family/patient expects to be discharged to:: Unsure Living Arrangements: Children                               Additional Comments: per chart, daughter works during the day.  Pt states her commode is normal and she stands in shower and uses hand held shower      Prior Functioning/Environment Level of Independence: Independent with assistive device(s)                 OT  Problem List: Decreased strength;Decreased activity tolerance;Impaired balance (sitting and/or standing);Decreased cognition;Decreased safety awareness;Decreased knowledge of use of DME or AE      OT Treatment/Interventions: Self-care/ADL training;DME and/or AE instruction;Patient/family education;Balance training;Therapeutic activities    OT Goals(Current goals can be found in the care plan section) Acute Rehab OT Goals Patient Stated Goal: likes to play games on her phone OT Goal Formulation: With patient Time For Goal Achievement: 01/14/17 Potential to Achieve Goals: Good ADL Goals Pt Will Perform Grooming: with supervision;standing Pt Will Transfer to Toilet: with supervision;ambulating;bedside commode (vs standard) Pt Will Perform Toileting - Clothing Manipulation and hygiene: with supervision;sit to/from stand Additional ADL Goal #1: pt will perform adl with setup only, sit to stand, maintaiing attention to task and not needing cues for safety  OT Frequency: Min 2X/week   Barriers to D/C:            Co-evaluation              AM-PAC PT "6 Clicks" Daily Activity     Outcome Measure Help from another person eating meals?: None Help from another person taking care of personal grooming?: A Little Help from another person toileting, which includes using toliet, bedpan, or urinal?: A Little Help from another person bathing (including washing, rinsing, drying)?: A Little Help from another person to put on and taking off regular upper body clothing?: A Little Help from another person to put on and taking off regular lower body clothing?: A Little 6 Click Score: 19   End of Session    Activity Tolerance: Patient tolerated treatment well Patient left: in chair;with call bell/phone within reach;with chair alarm set  OT Visit Diagnosis: Unsteadiness on feet (R26.81);Muscle weakness (generalized) (M62.81)                Time: 1610-9604 OT Time Calculation (min): 20  min Charges:  OT General Charges $OT Visit: 1 Procedure OT Evaluation $OT Eval Moderate Complexity: 1 Procedure G-Codes: OT G-codes **NOT FOR INPATIENT CLASS** Functional Assessment Tool Used: Clinical judgement Functional Limitation: Self care Self Care Current Status (V4098): At least 20 percent but less than 40 percent impaired, limited or restricted Self Care Goal Status (J1914): At least 1 percent but less than 20 percent impaired, limited or restricted   Marica Otter, OTR/L [phone] 01/07/2017  Kimberly [name] 01/07/2017, 1:49 PM

## 2017-01-07 NOTE — Care Management Note (Signed)
Case Management Note  Patient Details  Name: Kimberly [name] MRN: [id number] Date of Birth: April 29, 1941  Subjective/Objective: 76 y/o f admitted w/Acute hepatic encephalopathy. From home w/dtr-Beth c#[phone]-spoke to dtr about d/c plans-she states she has always picked up patient meds from pharmacy,& made sure she was taking her meds, use [facility], decline private pay for SNF,only wants aide to asst w/ADL's when she's not there-provided w/private sitter list as resource-she has already started exploring helping hands.  OT-recc HHOT, PT-supv. Patient has a high co pay for [facility] services & OT cannot stand alone.                    Action/Plan:d/c plan home.   Expected Discharge Date:   (unknown)               Expected Discharge Plan:  Home/Self Care  In-House Referral:     Discharge planning Services  CM Consult  Post Acute Care Choice:    Choice offered to:     DME Arranged:    DME Agency:     HH Arranged:    HH Agency:     Status of Service:  In process, will continue to follow  If discussed at Long Length of Stay Meetings, dates discussed:    Additional Comments:  Lanier Clam, RN 01/07/2017, 2:02 PM

## 2017-01-07 NOTE — Progress Notes (Signed)
PROGRESS NOTE    Kimberly [name]  WUJ:[id number] DOB: Dec 15, 1940 DOA: 01/06/2017 PCP: Patient, No Pcp Per    Brief Narrative:  76 y.o. female with medical history significant of non-alcoholic cirrhosis, diabetes mellitus, hypertension, thrombocytopenia, portal hypertension, esophageal varicies, hypothyroisism. Patient presented to the ED secondary to altered mental status. Patient does not know why she is in the hospital secondary to acute mental status change. Per daughter, this morning, the patient was acting more confused than usual. She has had a history of memory loss recently, for which she is setting up follow-up with neurology as an outpatient. Lately, the patient's daughter has been giving lactulose 15ml multiple times per day to treat acute mental status changes from hepatic encephalopathy, which usually works  Assessment & Plan:   Principal Problem:   Acute hepatic encephalopathy Active Problems:   Cirrhosis (HCC)   Uncontrolled type 2 diabetes mellitus with hyperglycemia (HCC)   Portal hypertension (HCC)   Hyperbilirubinemia   Esophageal varices (HCC)   Atelectasis   Essential hypertension   Hypothyroidism, unspecified  Altered mental status Likely acute hepatic encephalopathy. Presenting ammonia of 96. Patient has been taking her lactulose per daughter's report.  -Patient has been continued on lactulose 20 mg by mouth twice a day -We'll repeat ammonia level morning -Patient clinically improved -PT/OT consulted, therapy recommendations for home health PT  Cirrhosis with Portal hypertension, Hyperbilirubinemia, Esophageal varicies Sees [facility] as an outpatient. Takes propranolol 20mg  daily -reduced propranolol to 10mg  -lactulose will be continued as per above -Appear to be stable this time  Insulin dependent diabetes mellitus Hemoglobin A1C of 10.3 on 12/12/16. 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 Stable. -Repeat CBC in the morning  Hypothyroidism -continue Synthroid daily -Stable at present  DVT prophylaxis: SCDs Code Status: DO NOT RESUSCITATE Family Communication: Patient in room, family not at bedside Disposition Plan: Uncertain at this time  Consultants:     Procedures:     Antimicrobials: Anti-infectives    None       Subjective: Confused, however appears more oriented today  Objective: Vitals:   01/06/17 1645 01/06/17 2017 01/07/17 0538 01/07/17 1352  BP: 124/64 122/67 109/64 115/68  Pulse: (!) 48 (!) 56 66 (!) 57  Resp: 18 18 18 18   Temp: 98.1 F (36.7 C) 98.2 F (36.8 C) 97.7 F (36.5 C) 98.5 F (36.9 C)  TempSrc: Oral Oral Oral Oral  SpO2: 100% 100% 100% 99%  Weight:      Height:        Intake/Output Summary (Last 24 hours) at 01/07/17 1721 Last data filed at 01/07/17 1151  Gross per 24 hour  Intake              840 ml  Output                0 ml  Net              840 ml   Filed Weights   01/06/17 1101 01/06/17 1637  Weight: 68 kg (150 lb) 76.5 kg (168 lb 10.4 oz)  Examination:  General exam: Appears calm and comfortable  Respiratory system: Clear to auscultation. Respiratory effort normal. Cardiovascular system: S1 & S2 heard, RRR. No JVD, murmurs, rubs, gallops or clicks. No pedal edema. Gastrointestinal system: Abdomen is nondistended, soft and nontender. No organomegaly or masses felt. Normal bowel sounds heard. Central nervous system: Alert and  oriented. No focal neurological deficits. Extremities: Symmetric 5 x 5 power. Skin: No rashes, lesions  Psychiatry: Judgement and insight appear normal. Mood & affect appropriate.   Data Reviewed: I have personally reviewed following labs and imaging studies  CBC:  Recent Labs Lab 01/06/17 1154 01/07/17 0546  WBC 3.3* 4.0  NEUTROABS 1.7  --   HGB 12.6 14.1  HCT 37.2 41.1  MCV 90.1 90.5  PLT 81* 80*   Basic Metabolic Panel:  Recent Labs Lab 01/06/17 1154 01/07/17 0546  NA 136 139  K 3.5 3.4*  CL 101 106  CO2 28 27  GLUCOSE 271* 224*  BUN 7 6  CREATININE 0.67 0.46  CALCIUM 8.0* 8.3*   GFR: Estimated Creatinine Clearance: 60.4 mL/min (by C-G formula based on SCr of 0.46 mg/dL). Liver Function Tests:  Recent Labs Lab 01/06/17 1154 01/07/17 0546  AST 57* 62*  ALT 42 43  ALKPHOS 51 49  BILITOT 1.6* 2.0*  PROT 5.8* 6.2*  ALBUMIN 2.8* 2.9*   No results for input(s): LIPASE, AMYLASE in the last 168 hours.  Recent Labs Lab 01/06/17 1154  AMMONIA 96*   Coagulation Profile:  Recent Labs Lab 01/06/17 1154  INR 1.36   Cardiac Enzymes: No results for input(s): CKTOTAL, CKMB, CKMBINDEX, TROPONINI in the last 168 hours. BNP (last 3 results) No results for input(s): PROBNP in the last 8760 hours. HbA1C: No results for input(s): HGBA1C in the last 72 hours. CBG:  Recent Labs Lab 01/06/17 1706 01/06/17 2144 01/07/17 0728 01/07/17 1114 01/07/17 1629  GLUCAP 298* 301* 224* 351* 237*   Lipid Profile: No results for input(s): CHOL, HDL, LDLCALC, TRIG, CHOLHDL, LDLDIRECT in the last 72 hours. Thyroid Function Tests: No results for input(s): TSH, T4TOTAL, FREET4, T3FREE, THYROIDAB in the last 72 hours. Anemia Panel: No results for input(s): VITAMINB12, FOLATE, FERRITIN, TIBC, IRON, RETICCTPCT in the last 72 hours. Sepsis Labs: No results for input(s): PROCALCITON, LATICACIDVEN in the last 168 hours.  No results found for this or any previous visit (from  the past 240 hour(s)).   Radiology Studies: Dg Chest 2 View  Result Date: 01/06/2017 CLINICAL DATA:  76 year old presenting with acute confusion. Current history of diabetes and hypertension. Former smoker. EXAM: CHEST  2 VIEW COMPARISON:  04/21/2016. FINDINGS: AP semi-erect and lateral images were obtained. Suboptimal inspiration accounts for crowded bronchovascular markings, especially in the bases, and accentuates the cardiac silhouette. Taking this into account, cardiac silhouette upper normal in size, unchanged. Thoracic aorta atherosclerotic, unchanged. Hilar and mediastinal contours otherwise unremarkable. Streaky and patchy opacities in the medial right lower lobe. Lungs otherwise clear. No pleural effusions. No pneumothorax. Normal pulmonary vascularity. Likely old healed mildly impacted fracture of the left humeral neck. Degenerative changes involving the thoracic spine. IMPRESSION: Suboptimal inspiration. Atelectasis versus bronchopneumonia involving the medial right lower lobe. Electronically Signed   By: Hulan Saas M.D.   On: 01/06/2017 11:50   Ct Head Wo Contrast  Result Date: 01/06/2017 CLINICAL DATA:  Altered mental status/confusion EXAM: CT HEAD WITHOUT CONTRAST TECHNIQUE: Contiguous axial images were obtained from the base of the skull through the vertex without intravenous contrast. COMPARISON:  None. FINDINGS: Brain: There  is age related volume loss. There is no intracranial mass, hemorrhage, extra-axial fluid collection, or midline shift. There is patchy small vessel disease in the centra semiovale bilaterally. Elsewhere gray-white compartments appear normal. There is no evident acute infarct. Vascular: There is no hyperdense vessel appreciable. There are foci of calcification in each carotid siphon region. Skull: Bony calvarium appears intact. Sinuses/Orbits: There is a small benign osteoma in the posterior right ethmoid air cell complex medially. There is slight mucosal thickening  of several ethmoid air cells. Visualized paranasal sinuses elsewhere are clear. Orbits appear symmetric bilaterally. Other: Mastoid air cells are clear. IMPRESSION: Age related volume loss with patchy periventricular small vessel disease. No intracranial mass, hemorrhage, extra-axial fluid collection. No acute appearing infarct. Foci of calcification in the carotid siphon regions. There is mild ethmoid sinus disease bilaterally. Electronically Signed   By: Bretta Bang III M.D.   On: 01/06/2017 11:46    Scheduled Meds: . insulin aspart  0-5 Units Subcutaneous QHS  . insulin aspart  0-9 Units Subcutaneous TID WC  . insulin aspart  5 Units Subcutaneous TID WC  . insulin NPH Human  20 Units Subcutaneous BID AC & HS  . lactulose  20 g Oral BID  . levothyroxine  112 mcg Oral QAC breakfast  . lisinopril  5 mg Oral Daily  . propranolol  10 mg Oral Daily  . sodium chloride flush  3 mL Intravenous Q12H   Continuous Infusions:   LOS: 0 days   CHIU, Scheryl Marten, MD [facility] Pager [phone]  If 7PM-7AM, please contact night-coverage [email or web address] Password [email or web address] 01/07/2017, 5:21 PM

## 2017-01-07 NOTE — Care Management Obs Status (Signed)
MEDICARE OBSERVATION STATUS NOTIFICATION   Patient Details  Name: Kimberly [name] MRN: [id number] Date of Birth: [birth year]   Medicare Observation Status Notification Given:  Yes    MahabirOlegario Messier, RN 01/07/2017, 2:01 PM

## 2017-01-07 NOTE — Progress Notes (Deleted)
SATURATION QUALIFICATIONS: (This note is used to comply with regulatory documentation for home oxygen)  Patient Saturations on Room Air at Rest = 88  Patient Saturations on Room Air while Ambulating = 84  Patient Saturations on 2 Liters of oxygen while Ambulating = 88  Please briefly explain why patient needs home oxygen: Pt complained of SOB during ambulation.

## 2017-01-08 DIAGNOSIS — K7290 Hepatic failure, unspecified without coma: Secondary | ICD-10-CM | POA: Diagnosis not present

## 2017-01-08 DIAGNOSIS — Z66 Do not resuscitate: Secondary | ICD-10-CM | POA: Diagnosis not present

## 2017-01-08 DIAGNOSIS — E039 Hypothyroidism, unspecified: Secondary | ICD-10-CM | POA: Diagnosis not present

## 2017-01-08 DIAGNOSIS — K7030 Alcoholic cirrhosis of liver without ascites: Secondary | ICD-10-CM | POA: Diagnosis not present

## 2017-01-08 DIAGNOSIS — K766 Portal hypertension: Secondary | ICD-10-CM | POA: Diagnosis not present

## 2017-01-08 DIAGNOSIS — R001 Bradycardia, unspecified: Secondary | ICD-10-CM | POA: Diagnosis not present

## 2017-01-08 DIAGNOSIS — Z79899 Other long term (current) drug therapy: Secondary | ICD-10-CM | POA: Diagnosis not present

## 2017-01-08 DIAGNOSIS — K7682 Hepatic encephalopathy: Secondary | ICD-10-CM | POA: Diagnosis present

## 2017-01-08 DIAGNOSIS — I959 Hypotension, unspecified: Secondary | ICD-10-CM | POA: Diagnosis not present

## 2017-01-08 DIAGNOSIS — Z87891 Personal history of nicotine dependence: Secondary | ICD-10-CM | POA: Diagnosis not present

## 2017-01-08 DIAGNOSIS — K7469 Other cirrhosis of liver: Secondary | ICD-10-CM | POA: Diagnosis not present

## 2017-01-08 DIAGNOSIS — K7460 Unspecified cirrhosis of liver: Secondary | ICD-10-CM | POA: Diagnosis not present

## 2017-01-08 DIAGNOSIS — E7220 Disorder of urea cycle metabolism, unspecified: Secondary | ICD-10-CM | POA: Diagnosis not present

## 2017-01-08 DIAGNOSIS — K7200 Acute and subacute hepatic failure without coma: Secondary | ICD-10-CM | POA: Diagnosis not present

## 2017-01-08 DIAGNOSIS — J9811 Atelectasis: Secondary | ICD-10-CM | POA: Diagnosis not present

## 2017-01-08 DIAGNOSIS — D6959 Other secondary thrombocytopenia: Secondary | ICD-10-CM | POA: Diagnosis not present

## 2017-01-08 DIAGNOSIS — I8500 Esophageal varices without bleeding: Secondary | ICD-10-CM | POA: Diagnosis not present

## 2017-01-08 DIAGNOSIS — I10 Essential (primary) hypertension: Secondary | ICD-10-CM | POA: Diagnosis not present

## 2017-01-08 DIAGNOSIS — E1165 Type 2 diabetes mellitus with hyperglycemia: Secondary | ICD-10-CM | POA: Diagnosis not present

## 2017-01-08 DIAGNOSIS — Z794 Long term (current) use of insulin: Secondary | ICD-10-CM | POA: Diagnosis not present

## 2017-01-08 DIAGNOSIS — R410 Disorientation, unspecified: Secondary | ICD-10-CM | POA: Diagnosis not present

## 2017-01-08 LAB — COMPREHENSIVE METABOLIC PANEL
ALK PHOS: 52 U/L (ref 38–126)
ALT: 50 U/L (ref 14–54)
AST: 88 U/L — ABNORMAL HIGH (ref 15–41)
Albumin: 3.3 g/dL — ABNORMAL LOW (ref 3.5–5.0)
Anion gap: 8 (ref 5–15)
BUN: 8 mg/dL (ref 6–20)
CALCIUM: 8.7 mg/dL — AB (ref 8.9–10.3)
CO2: 26 mmol/L (ref 22–32)
CREATININE: 0.51 mg/dL (ref 0.44–1.00)
Chloride: 105 mmol/L (ref 101–111)
Glucose, Bld: 167 mg/dL — ABNORMAL HIGH (ref 65–99)
Potassium: 3.6 mmol/L (ref 3.5–5.1)
SODIUM: 139 mmol/L (ref 135–145)
Total Bilirubin: 2.4 mg/dL — ABNORMAL HIGH (ref 0.3–1.2)
Total Protein: 6.6 g/dL (ref 6.5–8.1)

## 2017-01-08 LAB — CBC
HCT: 40.6 % (ref 36.0–46.0)
Hemoglobin: 14 g/dL (ref 12.0–15.0)
MCH: 31 pg (ref 26.0–34.0)
MCHC: 34.5 g/dL (ref 30.0–36.0)
MCV: 90 fL (ref 78.0–100.0)
PLATELETS: 95 10*3/uL — AB (ref 150–400)
RBC: 4.51 MIL/uL (ref 3.87–5.11)
RDW: 16.3 % — AB (ref 11.5–15.5)
WBC: 4.9 10*3/uL (ref 4.0–10.5)

## 2017-01-08 LAB — GLUCOSE, CAPILLARY
GLUCOSE-CAPILLARY: 104 mg/dL — AB (ref 65–99)
GLUCOSE-CAPILLARY: 250 mg/dL — AB (ref 65–99)
GLUCOSE-CAPILLARY: 263 mg/dL — AB (ref 65–99)
GLUCOSE-CAPILLARY: 253 mg/dL — AB (ref 65–99)

## 2017-01-08 LAB — AMMONIA: AMMONIA: 102 umol/L — AB (ref 9–35)

## 2017-01-08 MED ORDER — LACTULOSE 10 GM/15ML PO SOLN
20.0000 g | Freq: Three times a day (TID) | ORAL | Status: DC
  Administered 2017-01-08 – 2017-01-10 (×6): 20 g via ORAL
  Filled 2017-01-08 (×6): qty 30

## 2017-01-08 MED ORDER — INSULIN ASPART 100 UNIT/ML ~~LOC~~ SOLN: 5.0000 [IU] | Freq: Three times a day (TID) | SUBCUTANEOUS | Status: DC

## 2017-01-08 NOTE — Progress Notes (Signed)
PROGRESS NOTE    Kimberly [name]  KMQ:[id number] DOB: 1941-06-05 DOA: 01/06/2017 PCP: Patient, No Pcp Per    Brief Narrative:  76 y.o. female with medical history significant of non-alcoholic cirrhosis, diabetes mellitus, hypertension, thrombocytopenia, portal hypertension, esophageal varicies, hypothyroisism. Patient presented to the ED secondary to altered mental status. Patient does not know why she is in the hospital secondary to acute mental status change. Per daughter, this morning, the patient was acting more confused than usual. She has had a history of memory loss recently, for which she is setting up follow-up with neurology as an outpatient. Lately, the patient's daughter has been giving lactulose 75ml multiple times per day to treat acute mental status changes from hepatic encephalopathy, which usually works  Assessment & Plan:   Principal Problem:   Acute hepatic encephalopathy Active Problems:   Cirrhosis (HCC)   Uncontrolled type 2 diabetes mellitus with hyperglycemia (HCC)   Portal hypertension (HCC)   Hyperbilirubinemia   Esophageal varices (HCC)   Atelectasis   Essential hypertension   Hypothyroidism, unspecified  Altered mental status Likely acute hepatic encephalopathy. Presenting ammonia of 96. Patient has been taking her lactulose per daughter's report.  -Patient has been continued on lactulose 20 mg by mouth twice a day -Ammonia higher at over 100 -Will increase lactulose to 20mg  TID -Stable thus far -PT/OT consulted, recs for SNF  Cirrhosis with Portal hypertension, Hyperbilirubinemia, Esophageal varicies Sees [facility] as an outpatient. Takes propranolol 20mg  daily -reduced propranolol to 10mg  -lactulose will be continued as per above -Currently stable  Insulin dependent diabetes mellitus Hemoglobin A1C of 10.3 on 12/12/16. 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 Stable. -recheck cbc in AM  Hypothyroidism -continue Synthroid daily -presently stable  DVT prophylaxis: SCDs Code Status: DO NOT RESUSCITATE Family Communication: Patient in room, family not at bedside Disposition Plan: Uncertain at this time  Consultants:     Procedures:     Antimicrobials: Anti-infectives    None      Subjective: Still  Mildly confused, in nad  Objective: Vitals:   01/07/17 1352 01/07/17 2106 01/08/17 0408 01/08/17 1247  BP: 115/68 (!) 145/74 132/75 109/63  Pulse: (!) 57 (!) 58 (!) 55 62  Resp: 18 18 16 18   Temp: 98.5 F (36.9 C) 98.3 F (36.8 C) 98.4 F (36.9 C) 98.3 F (36.8 C)  TempSrc: Oral Oral Oral Oral  SpO2: 99% 99% 98% 100%  Weight:      Height:        Intake/Output Summary (Last 24 hours) at 01/08/17 1734 Last data filed at 01/08/17 1222  Gross per 24 hour  Intake              360 ml  Output                0 ml  Net              360 ml   Filed Weights   01/06/17 1101 01/06/17 1637  Weight: 68 kg (150 lb) 76.5 kg (168 lb 10.4 oz)    Examination: General exam:  Awake, laying in bed, in nad Respiratory system: Normal respiratory effort, no wheezing Cardiovascular system: regular rate, s1, s2 Gastrointestinal system: Soft, nondistended, positive BS Central nervous system: CN2-12 grossly intact, strength intact Extremities: Perfused, no clubbing Skin: Normal skin turgor, no notable skin lesions seen Psychiatry: Mood normal // no visual hallucinations     Data Reviewed: I  have personally reviewed following labs and imaging studies  CBC:  Recent Labs Lab 01/06/17 1154 01/07/17 0546 01/08/17 0809  WBC 3.3* 4.0 4.9  NEUTROABS 1.7  --   --   HGB 12.6 14.1 14.0  HCT 37.2 41.1 40.6  MCV 90.1 90.5 90.0  PLT 81* 80* 95*   Basic Metabolic Panel:  Recent Labs Lab 01/06/17 1154 01/07/17 0546 01/08/17 0809  NA 136 139 139  K 3.5 3.4* 3.6  CL 101 106 105  CO2 28 27 26   GLUCOSE 271* 224* 167*  BUN 7 6 8   CREATININE 0.67 0.46 0.51  CALCIUM 8.0* 8.3* 8.7*   GFR: Estimated Creatinine Clearance: 60.4 mL/min (by C-G formula based on SCr of 0.51 mg/dL). Liver Function Tests:  Recent Labs Lab 01/06/17 1154 01/07/17 0546 01/08/17 0809  AST 57* 62* 88*  ALT 42 43 50  ALKPHOS 51 49 52  BILITOT 1.6* 2.0* 2.4*  PROT 5.8* 6.2* 6.6  ALBUMIN 2.8* 2.9* 3.3*   No results for input(s): LIPASE, AMYLASE in the last 168 hours.  Recent Labs Lab 01/06/17 1154 01/08/17 0809  AMMONIA 96* 102*   Coagulation Profile:  Recent Labs Lab 01/06/17 1154  INR 1.36   Cardiac Enzymes: No results for input(s): CKTOTAL, CKMB, CKMBINDEX, TROPONINI in the last 168 hours. BNP (last 3 results) No results for input(s): PROBNP in the last 8760 hours. HbA1C: No results for input(s): HGBA1C in the last 72 hours. CBG:  Recent Labs Lab 01/07/17 1629 01/07/17 2110 01/08/17 0729 01/08/17 1135 01/08/17 1618  GLUCAP 237* 218* 104* 250* 263*   Lipid Profile: No results for input(s): CHOL, HDL, LDLCALC, TRIG, CHOLHDL, LDLDIRECT in the last 72 hours. Thyroid Function Tests: No results for input(s): TSH, T4TOTAL, FREET4, T3FREE, THYROIDAB in the last 72 hours. Anemia Panel: No results for input(s): VITAMINB12, FOLATE, FERRITIN, TIBC, IRON, RETICCTPCT in the last 72 hours. Sepsis Labs: No results for input(s): PROCALCITON, LATICACIDVEN in the last 168 hours.  No results found for this or any previous visit (from the past 240 hour(s)).   Radiology Studies: No  results found.  Scheduled Meds: . insulin aspart  0-5 Units Subcutaneous QHS  . insulin aspart  0-9 Units Subcutaneous TID WC  . insulin aspart  5 Units Subcutaneous TID WC  . insulin NPH Human  20 Units Subcutaneous BID AC & HS  . lactulose  20 g Oral TID  . levothyroxine  112 mcg Oral QAC breakfast  . lisinopril  5 mg Oral Daily  . propranolol  10 mg Oral Daily  . sodium chloride flush  3 mL Intravenous Q12H   Continuous Infusions:   LOS: 0 days   Kimberly [name], Scheryl Marten, MD [facility] Pager [phone]  If 7PM-7AM, please contact night-coverage [email or web address] Password [facility] 01/08/2017, 5:34 PM

## 2017-01-08 NOTE — Progress Notes (Signed)
Occupational Therapy Treatment Patient Details Name: Kimberly [name] MRN: [id number] DOB: 1940/12/18 Today's Date: 01/08/2017    History of present illness  76 y.o. female with medical history significant of non-alcoholic cirrhosis, diabetes mellitus, hypertension, thrombocytopenia, portal hypertension, esophageal varicies, hypothyroisism. Patient presented to the ED secondary to altered mental status.   OT comments  Continue to recommend SNF vs 24/7 at home for safety due to cogntiion  Follow Up Recommendations  SNF (vs 24/7 with [facility])    Equipment Recommendations       Recommendations for Other Services      Precautions / Restrictions Precautions Precautions: Fall Restrictions Weight Bearing Restrictions: No       Mobility Bed Mobility               General bed mobility comments: OOB  Transfers   Equipment used: Rolling walker (2 wheeled)   Sit to Stand: Supervision         General transfer comment: cues for UE placement    Balance             Standing balance-Leahy Scale: Poor                             ADL either performed or assessed with clinical judgement   ADL       Grooming: Wash/dry hands;Supervision/safety;Standing                   Toilet Transfer: Min guard;Ambulation;RW;BSC   Toileting- Architect and Hygiene: Min guard;Sit to/from stand         General ADL Comments: see cognition above.  Pt needs supervision and assistance for safety when moving around, especially obstacles.  Did not have LOB     Vision       Perception     Praxis      Cognition Arousal/Alertness: Awake/alert Behavior During Therapy: WFL for tasks assessed/performed Overall Cognitive Status: Impaired/Different from baseline Area of Impairment: Following commands;Safety/judgement;Problem solving;Attention                   Current Attention Level: Sustained   Following Commands: Follows one step commands with  increased time Safety/Judgement: Decreased awareness of safety;Decreased awareness of deficits   Problem Solving: Slow processing;Requires verbal cues General Comments: pt has difficulty processing  cues:  does better when things are automatic. She needed assistance to place hands when sitting and standing with, turning walker to stay in front of her and negotiating around obstacles.          Exercises     Shoulder Instructions       General Comments      Pertinent Vitals/ Pain       Pain Assessment: No/denies pain  Home Living                                          Prior Functioning/Environment              Frequency  Min 2X/week        Progress Toward Goals  OT Goals(current goals can now be found in the care plan section)  Progress towards OT goals: Progressing toward goals     Plan      Co-evaluation                 AM-PAC PT "  6 Clicks" Daily Activity     Outcome Measure   Help from another person eating meals?: None Help from another person taking care of personal grooming?: A Little Help from another person toileting, which includes using toliet, bedpan, or urinal?: A Little Help from another person bathing (including washing, rinsing, drying)?: A Little Help from another person to put on and taking off regular upper body clothing?: A Little Help from another person to put on and taking off regular lower body clothing?: A Little 6 Click Score: 19    End of Session        Activity Tolerance Patient tolerated treatment well   Patient Left in chair;with call bell/phone within reach;with chair alarm set   Nurse Communication          Time: 1610-9604 OT Time Calculation (min): 14 min  Charges: OT General Charges $OT Visit: 1 Procedure OT Treatments $Self Care/Home Management : 8-22 mins  Marica Otter, OTR/L [phone] 01/08/2017   Kimberly [name] 01/08/2017, 12:18 PM

## 2017-01-09 DIAGNOSIS — K7290 Hepatic failure, unspecified without coma: Secondary | ICD-10-CM

## 2017-01-09 LAB — GLUCOSE, CAPILLARY
GLUCOSE-CAPILLARY: 110 mg/dL — AB (ref 65–99)
GLUCOSE-CAPILLARY: 285 mg/dL — AB (ref 65–99)
Glucose-Capillary: 357 mg/dL — ABNORMAL HIGH (ref 65–99)
Glucose-Capillary: 308 mg/dL — ABNORMAL HIGH (ref 65–99)

## 2017-01-09 LAB — COMPREHENSIVE METABOLIC PANEL
ALT: 54 U/L (ref 14–54)
ANION GAP: 7 (ref 5–15)
AST: 102 U/L — ABNORMAL HIGH (ref 15–41)
Albumin: 3.2 g/dL — ABNORMAL LOW (ref 3.5–5.0)
Alkaline Phosphatase: 51 U/L (ref 38–126)
BILIRUBIN TOTAL: 1.9 mg/dL — AB (ref 0.3–1.2)
BUN: 11 mg/dL (ref 6–20)
CO2: 26 mmol/L (ref 22–32)
CREATININE: 0.56 mg/dL (ref 0.44–1.00)
Calcium: 9 mg/dL (ref 8.9–10.3)
Chloride: 107 mmol/L (ref 101–111)
GFR calc non Af Amer: >60 mL/min (ref >60)
Glucose, Bld: 112 mg/dL — ABNORMAL HIGH (ref 65–99)
Potassium: 3.8 mmol/L (ref 3.5–5.1)
SODIUM: 140 mmol/L (ref 135–145)
TOTAL PROTEIN: 6.5 g/dL (ref 6.5–8.1)

## 2017-01-09 LAB — AMMONIA: Ammonia: 59 umol/L — ABNORMAL HIGH (ref 9–35)

## 2017-01-09 LAB — HEMOGLOBIN A1C
Hgb A1c MFr Bld: 11.5 % — ABNORMAL HIGH (ref 4.8–5.6)
Mean Plasma Glucose: 283 mg/dL

## 2017-01-09 MED ORDER — MORPHINE SULFATE (PF) 2 MG/ML IV SOLN
0.5000 mg | Freq: Once | INTRAVENOUS | Status: AC
  Administered 2017-01-09: 0.5 mg via INTRAVENOUS
  Filled 2017-01-09: qty 1

## 2017-01-09 MED ORDER — ALPRAZOLAM 0.5 MG PO TABS
0.5000 mg | ORAL_TABLET | Freq: Once | ORAL | Status: AC
  Administered 2017-01-09: 0.5 mg via ORAL
  Filled 2017-01-09: qty 1

## 2017-01-09 NOTE — Progress Notes (Signed)
PROGRESS NOTE    [facility] [name]  MVH:[id number] DOB: February 16, 1941 DOA: 01/06/2017 PCP: [name], No Pcp Per    Brief Narrative:  76 y.o. female with medical history significant of non-alcoholic cirrhosis, diabetes mellitus, hypertension, thrombocytopenia, portal hypertension, esophageal varicies, hypothyroisism. [name] presented to [facility] secondary to altered mental status. [name] does not know why she is in [facility] hospital secondary to acute mental status change. Per daughter, [facility] [name], [facility] [name] was acting more confused than usual. She has had a history of memory loss recently, for which she is setting up follow-up with neurology as an outpatient. Lately, [facility] [name]'s daughter has been giving lactulose 15ml multiple times per day to treat acute mental status changes from hepatic encephalopathy, which usually works  Assessment & Plan:   Principal Problem:   Acute hepatic encephalopathy Active Problems:   Cirrhosis (HCC)   Uncontrolled type 2 diabetes mellitus with hyperglycemia (HCC)   Portal hypertension (HCC)   Hyperbilirubinemia   Esophageal varices (HCC)   Atelectasis   Essential hypertension   Hypothyroidism, unspecified   Hepatic encephalopathy (HCC)  Altered mental status Likely acute hepatic encephalopathy. Presenting ammonia of 96. [name] has been taking her lactulose per daughter's report.  -Pt now on lactulose 20mg  TID -Ammonia improved to 54 with improved mentation -Therapy recs for HHPT  Cirrhosis with Portal hypertension, Hyperbilirubinemia, Esophageal varicies Sees [facility] as an outpatient. Takes propranolol 20mg  daily -reduced propranolol to 10mg  -lactulose will be continued as per above -remains stable  Insulin dependent diabetes mellitus Hemoglobin A1C of 10.3 on 12/12/16. 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 Stable. -repeat cbc in AM  Hypothyroidism -continue Synthroid daily, tolerating -Stable  DVT prophylaxis: SCDs Code Status: DO NOT RESUSCITATE Family Communication: [name] in room, family not at bedside Disposition Plan: Uncertain at [facility] time  Consultants:     Procedures:     Antimicrobials: Anti-infectives    None      Subjective: Less confused, no complaints  Objective: Vitals:   01/08/17 1247 01/08/17 2116 01/09/17 0516 01/09/17 1310  BP: 109/63 114/80 127/77 108/63  Pulse: 62 61 61 64  Resp: 18 16 16 18   Temp: 98.3 F (36.8 C) 98.3 F (36.8 C) 97.8 F (36.6 C) 98.3 F (36.8 C)  TempSrc: Oral Oral Oral Oral  SpO2: 100% 98% 96% 94%  Weight:      Height:        Intake/Output Summary (Last 24 hours) at 01/09/17 1731 Last data filed at 01/09/17 1700  Gross per 24 hour  Intake              720 ml  Output                0 ml  Net              720 ml   Filed Weights   01/06/17 1101 01/06/17 1637  Weight: 68 kg (150 lb) 76.5 kg (168 lb 10.4 oz)    Examination: General exam: Conversant, in no acute distress Respiratory system: normal chest rise, clear, no audible wheezing  Cardiovascular system: regular rhythm, s1-s2 Gastrointestinal system: Nondistended, nontender, pos BS Central nervous system: No seizures, no tremors, no asterixis  Extremities: No cyanosis, no joint deformities Skin: No rashes, no pallor Psychiatry: Affect normal // no auditory hallucinations     Data Reviewed: I have personally reviewed following labs and  imaging studies  CBC:  Recent Labs Lab 01/06/17 1154 01/07/17 0546 01/08/17 0809  WBC 3.3* 4.0 4.9  NEUTROABS 1.7  --   --   HGB 12.6 14.1 14.0  HCT 37.2 41.1 40.6  MCV 90.1 90.5 90.0  PLT 81* 80* 95*   Basic Metabolic Panel:  Recent Labs Lab 01/06/17 1154 01/07/17 0546 01/08/17 0809 01/09/17 0443  NA 136 139 139 140  K 3.5 3.4* 3.6 3.8  CL 101 106 105 107  CO2 28 27 26 26   GLUCOSE 271* 224* 167* 112*  BUN 7 6 8 11   CREATININE 0.67 0.46 0.51 0.56  CALCIUM 8.0* 8.3* 8.7* 9.0   GFR: Estimated Creatinine Clearance: 60.4 mL/min (by C-G formula based on SCr of 0.56 mg/dL). Liver Function Tests:  Recent Labs Lab 01/06/17 1154 01/07/17 0546 01/08/17 0809 01/09/17 0443  AST 57* 62* 88* 102*  ALT 42 43 50 54  ALKPHOS 51 49 52 51  BILITOT 1.6* 2.0* 2.4* 1.9*  PROT 5.8* 6.2* 6.6 6.5  ALBUMIN 2.8* 2.9* 3.3* 3.2*   No results for input(s): LIPASE, AMYLASE in [facility] last 168 hours.  Recent Labs Lab 01/06/17 1154 01/08/17 0809 01/09/17 0443  AMMONIA 96* 102* 59*   Coagulation Profile:  Recent Labs Lab 01/06/17 1154  INR 1.36   Cardiac Enzymes: No results for input(s): CKTOTAL, CKMB, CKMBINDEX, TROPONINI in [facility] last 168 hours. BNP (last 3 results) No results for input(s): PROBNP in [facility] last 8760 hours. HbA1C:  Recent Labs  01/08/17 0809  HGBA1C 11.5*   CBG:  Recent Labs Lab 01/08/17 1618 01/08/17 2113 01/09/17 0739 01/09/17 1136 01/09/17 1623  GLUCAP 263* 253* 110* 357* 308*   Lipid Profile: No results for input(s): CHOL, HDL, LDLCALC, TRIG, CHOLHDL, LDLDIRECT in [facility] last 72 hours. Thyroid Function Tests: No results for input(s): TSH, T4TOTAL, FREET4, T3FREE, THYROIDAB in [facility] last 72 hours. Anemia Panel: No results for input(s): VITAMINB12, FOLATE, FERRITIN, TIBC, IRON, RETICCTPCT in [facility] last 72 hours. Sepsis Labs: No results for input(s): PROCALCITON, LATICACIDVEN in [facility] last 168 hours.  No results found for [facility] or any previous visit  (from [facility] past 240 hour(s)).   Radiology Studies: No results found.  Scheduled Meds: . insulin aspart  0-5 Units Subcutaneous QHS  . insulin aspart  0-9 Units Subcutaneous TID WC  . insulin aspart  5 Units Subcutaneous TID WC  . insulin NPH Human  20 Units Subcutaneous BID AC & HS  . lactulose  20 g Oral TID  . levothyroxine  112 mcg Oral QAC breakfast  . lisinopril  5 mg Oral Daily  . propranolol  10 mg Oral Daily  . sodium chloride flush  3 mL Intravenous Q12H   Continuous Infusions:   LOS: 1 day   Jabar Krysiak, Scheryl Marten, MD [facility] Pager [phone]  If 7PM-7AM, please contact night-coverage [email or web address] Password [facility] 01/09/2017, 5:31 PM

## 2017-01-09 NOTE — Patient Care Conference (Signed)
Called patient's daughter at number listed for update. No answer. Will try again later.

## 2017-01-09 NOTE — Progress Notes (Signed)
Patient requesting her Trazodone medication. Not listed on home med list, but is listed in H&P note with no dosage. Patient does not know dosage she takes at home. On-Call MD notified who ordered 0.5 mg xanax PO x1 d/t unknown dosage of Trazodone. Will pass on to next RN to investigate dosage tomorrow.

## 2017-01-09 NOTE — Progress Notes (Signed)
Inpatient Diabetes Program Recommendations  AACE/ADA: New Consensus Statement on Inpatient Glycemic Control (2015)  Target Ranges:  Prepandial:   less than 140 mg/dL      Peak postprandial:   less than 180 mg/dL (1-2 hours)      Critically ill patients:  140 - 180 mg/dL   Lab Results  Component Value Date   GLUCAP 110 (H) 01/09/2017   HGBA1C 11.5 (H) 01/08/2017    Review of Glycemic Control  Diabetes history: DM2 Outpatient Diabetes medications: NPH 20 units QD, Novolin R s/s Current orders for Inpatient glycemic control: NPH 20 units bid, Novolog 0-9 units tidwc and hs + 5 units tidwc   Inpatient Diabetes Program Recommendations:    Agree with orders.  Spoke with pt regarding her diabetes control at home. Pt states she does not take her insulin as prescribed. She misses doses and doesn't follow healthy diet. States she does check her blood sugars each day. Discussed HgbA1C of 10.2% and importance of getting blood sugars in tighter control.  Will need to be discharged on NPH 20 units bid instead of once/day. Discussed with RN.  Thank you. Ailene Ards, RD, LDN, CDE Inpatient Diabetes Coordinator [phone]

## 2017-01-09 NOTE — Progress Notes (Signed)
Physical Therapy Treatment Patient Details Name: [facility] [name] MRN: [id number] DOB: 1940/12/17 Today's Date: 01/09/2017    History of Present Illness  76 y.o. female with medical history significant of non-alcoholic cirrhosis, diabetes mellitus, hypertension, thrombocytopenia, portal hypertension, esophageal varicies, hypothyroisism. Patient presented to the ED secondary to altered mental status.    PT Comments    Pt participated fairly well with therapy. She continues to require cueing and close guarding for safety. No family present during session. If pt does not have 24 hour supervision at home, may need to consider SNF placement. Will continue to follow and progress activity as tolerated.     Follow Up Recommendations  Home health PT;Supervision/Assistance - 24 hour vs SNF     Equipment Recommendations       Recommendations for Other Services       Precautions / Restrictions Precautions Precautions: Fall Precaution Comments: reports several falls in past year, can't recall how many Restrictions Weight Bearing Restrictions: No    Mobility  Bed Mobility Overal bed mobility: Independent                Transfers Overall transfer level: Needs assistance Equipment used: Rolling walker (2 wheeled) Transfers: Sit to/from Stand Sit to Stand: Supervision         General transfer comment: for safety  Ambulation/Gait Ambulation/Gait assistance: Min guard Ambulation Distance (Feet): 200 Feet Assistive device: Rolling walker (2 wheeled) Gait Pattern/deviations: Step-through pattern;Decreased stride length     General Gait Details: close guard for safety. Pt intermittently steered into objects in environment.    Stairs            Wheelchair Mobility    Modified Rankin (Stroke Patients Only)       Balance                                            Cognition Arousal/Alertness: Awake/alert Behavior During Therapy: WFL for tasks  assessed/performed Overall Cognitive Status: No family/caregiver present to determine baseline cognitive functioning                         Following Commands: Follows one step commands with increased time Safety/Judgement: Decreased awareness of safety   Problem Solving: Slow processing;Requires verbal cues        Exercises      General Comments        Pertinent Vitals/Pain Pain Assessment: No/denies pain    Home Living                      Prior Function            PT Goals (current goals can now be found in the care plan section) Progress towards PT goals: Progressing toward goals    Frequency    Min 3X/week      PT Plan Current plan remains appropriate    Co-evaluation              AM-PAC PT "6 Clicks" Daily Activity  Outcome Measure  Difficulty turning over in bed (including adjusting bedclothes, sheets and blankets)?: None Difficulty moving from lying on back to sitting on the side of the bed? : None Difficulty sitting down on and standing up from a chair with arms (e.g., wheelchair, bedside commode, etc,.)?: A Little Help needed moving to and from a bed to  chair (including a wheelchair)?: A Little Help needed walking in hospital room?: A Little Help needed climbing 3-5 steps with a railing? : A Little 6 Click Score: 20    End of Session Equipment Utilized During Treatment: Gait belt Activity Tolerance: Patient tolerated treatment well Patient left: in chair;with call bell/phone within reach;with chair alarm set   PT Visit Diagnosis: Muscle weakness (generalized) (M62.81);Difficulty in walking, not elsewhere classified (R26.2)     Time: 1007-1219 PT Time Calculation (min) (ACUTE ONLY): 22 min  Charges:  $Gait Training: 8-22 mins                    G Codes:         Rebeca Alert, MPT Pager: [phone]

## 2017-01-10 LAB — COMPREHENSIVE METABOLIC PANEL
ALT: 58 U/L — ABNORMAL HIGH (ref 14–54)
AST: 119 U/L — AB (ref 15–41)
Albumin: 2.9 g/dL — ABNORMAL LOW (ref 3.5–5.0)
Alkaline Phosphatase: 49 U/L (ref 38–126)
Anion gap: 8 (ref 5–15)
BUN: 9 mg/dL (ref 6–20)
CHLORIDE: 107 mmol/L (ref 101–111)
CO2: 26 mmol/L (ref 22–32)
CREATININE: 0.35 mg/dL — AB (ref 0.44–1.00)
Calcium: 8.6 mg/dL — ABNORMAL LOW (ref 8.9–10.3)
Glucose, Bld: 101 mg/dL — ABNORMAL HIGH (ref 65–99)
POTASSIUM: 3.9 mmol/L (ref 3.5–5.1)
SODIUM: 141 mmol/L (ref 135–145)
Total Bilirubin: 1.6 mg/dL — ABNORMAL HIGH (ref 0.3–1.2)
Total Protein: 6.1 g/dL — ABNORMAL LOW (ref 6.5–8.1)

## 2017-01-10 LAB — CBC
HEMATOCRIT: 38.1 % (ref 36.0–46.0)
HEMOGLOBIN: 13.3 g/dL (ref 12.0–15.0)
MCH: 31.7 pg (ref 26.0–34.0)
MCHC: 34.9 g/dL (ref 30.0–36.0)
MCV: 90.9 fL (ref 78.0–100.0)
Platelets: 95 10*3/uL — ABNORMAL LOW (ref 150–400)
RBC: 4.19 MIL/uL (ref 3.87–5.11)
RDW: 16.5 % — ABNORMAL HIGH (ref 11.5–15.5)
WBC: 4.5 10*3/uL (ref 4.0–10.5)

## 2017-01-10 LAB — GLUCOSE, CAPILLARY
Glucose-Capillary: 113 mg/dL — ABNORMAL HIGH (ref 65–99)
Glucose-Capillary: 284 mg/dL — ABNORMAL HIGH (ref 65–99)
Glucose-Capillary: 240 mg/dL — ABNORMAL HIGH (ref 65–99)
Glucose-Capillary: 286 mg/dL — ABNORMAL HIGH (ref 65–99)

## 2017-01-10 LAB — AMMONIA: Ammonia: 116 umol/L — ABNORMAL HIGH (ref 9–35)

## 2017-01-10 MED ORDER — LACTULOSE 10 GM/15ML PO SOLN
30.0000 g | Freq: Three times a day (TID) | ORAL | Status: DC
  Administered 2017-01-10 – 2017-01-11 (×3): 30 g via ORAL
  Filled 2017-01-10 (×3): qty 45

## 2017-01-10 MED ORDER — TRAZODONE HCL 50 MG PO TABS
50.0000 mg | ORAL_TABLET | Freq: Every evening | ORAL | Status: DC | PRN
  Administered 2017-01-10: 50 mg via ORAL
  Filled 2017-01-10: qty 1

## 2017-01-10 MED ORDER — INSULIN NPH (HUMAN) (ISOPHANE) 100 UNIT/ML ~~LOC~~ SUSP
25.0000 [IU] | Freq: Two times a day (BID) | SUBCUTANEOUS | Status: DC
  Administered 2017-01-10: 25 [IU] via SUBCUTANEOUS

## 2017-01-10 MED ORDER — LACTULOSE 10 GM/15ML PO SOLN
10.0000 g | ORAL | Status: AC
  Administered 2017-01-10: 10 g via ORAL
  Filled 2017-01-10: qty 15

## 2017-01-10 NOTE — Progress Notes (Signed)
PROGRESS NOTE    [name]  ZOX:[id number] DOB: 1941/05/21 DOA: 01/06/2017 PCP: [name], No Pcp Per    Brief Narrative:  76 y.o. female with medical history significant of non-alcoholic cirrhosis, diabetes mellitus, hypertension, thrombocytopenia, portal hypertension, esophageal varicies, hypothyroisism. [name] presented to the ED secondary to altered mental status. [name] does not know why she is in the hospital secondary to acute mental status change. Per daughter, this morning, the [facility] [name] was acting more confused than usual. She has had a history of memory loss recently, for which she is setting up follow-up with neurology as an outpatient. Lately, the [facility] [name]'s daughter has been giving lactulose 15ml multiple times per day to treat acute mental status changes from hepatic encephalopathy, which usually works  Assessment & Plan:   Principal Problem:   Acute hepatic encephalopathy Active Problems:   Cirrhosis (HCC)   Uncontrolled type 2 diabetes mellitus with hyperglycemia (HCC)   Portal hypertension (HCC)   Hyperbilirubinemia   Esophageal varices (HCC)   Atelectasis   Essential hypertension   Hypothyroidism, unspecified   Hepatic encephalopathy (HCC)  Altered mental status Likely acute hepatic encephalopathy. Presenting ammonia of 96. [name] has been taking her lactulose per daughter's report.  -Currently on lactulose 20mg  TID -Ammonia improved with improved mentation, however still encephalopathic -Increase lactulose dose to 30mg  TID  Cirrhosis with Portal hypertension, Hyperbilirubinemia, Esophageal varicies Sees [facility] as an outpatient. Takes propranolol 20mg  daily -reduced propranolol to 10mg  -lactulose will be continued as per above -presently stable  Insulin dependent diabetes mellitus Hemoglobin A1C of 10.3 on 12/12/16. Per recent endocrinology note, [facility] [name] switched to NPH secondary to cost -SSI sensitive -Glucose remains in the 200's -continued on 5 units of  pre-meal coverage -Glucose into the 300's. Will increase NPH to 30 units bid  Lung infiltrate Suspect atelectasis as [name] has no symptoms of pneumonia -Continue with incentive spirometer -presently stable  Essential hypertension -continue lisinopril 5mg  as tolerated -Propranolol dose decreased secondary to mild bradycardia, asymptomatic -currently stable and controlled  Bradycardia -reduce propranolol to 10mg  -stable at this time  Thrombocytopenia Secondary to chronic liver disease. Stable. -remains stable  Hypothyroidism -continue Synthroid daily, tolerating -Currently stable  DVT prophylaxis: SCDs Code Status: DO NOT RESUSCITATE Family Communication: [name] in room, family not at bedside Disposition Plan: Uncertain at this time  Consultants:     Procedures:     Antimicrobials: Anti-infectives    None      Subjective: Without complaints this AM  Objective: Vitals:   01/09/17 2057 01/10/17 0421 01/10/17 1428 01/10/17 1513  BP: 104/70 115/65 100/66 (!) 128/57  Pulse: 61 60 (!) 56 73  Resp: 18 20 18 18   Temp: 98.8 F (37.1 C) 99.1 F (37.3 C) 97.3 F (36.3 C) 98 F (36.7 C)  TempSrc: Oral Oral Oral Oral  SpO2: 98% 96% 100% 100%  Weight:      Height:        Intake/Output Summary (Last 24 hours) at 01/10/17 1650 Last data filed at 01/10/17 0943  Gross per 24 hour  Intake              840 ml  Output              650 ml  Net              190 ml   Filed Weights   01/06/17 1101 01/06/17 1637  Weight: 68 kg (150 lb) 76.5 kg (168 lb 10.4 oz)    Examination: General exam: Awake, laying  in reclined chair Respiratory system: Normal respiratory effort, no wheezing Cardiovascular system: regular rhythm, s1, s2 on auscultation Gastrointestinal system: Soft, nondistended, positive Bs, nontender Central nervous system: CN2-12 grossly intact, strength intact, notremors Extremities: Perfused, no clubbing Skin: Normal skin turgor, no  notable skin lesions seen Psychiatry: Mood normal // no auditory hallucinations    Data Reviewed: I have personally reviewed following labs and imaging studies  CBC:  Recent Labs Lab 01/06/17 1154 01/07/17 0546 01/08/17 0809 01/10/17 0655  WBC 3.3* 4.0 4.9 4.5  NEUTROABS 1.7  --   --   --   HGB 12.6 14.1 14.0 13.3  HCT 37.2 41.1 40.6 38.1  MCV 90.1 90.5 90.0 90.9  PLT 81* 80* 95* 95*   Basic Metabolic Panel:  Recent Labs Lab 01/06/17 1154 01/07/17 0546 01/08/17 0809 01/09/17 0443 01/10/17 0655  NA 136 139 139 140 141  K 3.5 3.4* 3.6 3.8 3.9  CL 101 106 105 107 107  CO2 28 27 26 26 26   GLUCOSE 271* 224* 167* 112* 101*  BUN 7 6 8 11 9   CREATININE 0.67 0.46 0.51 0.56 0.35*  CALCIUM 8.0* 8.3* 8.7* 9.0 8.6*   GFR: Estimated Creatinine Clearance: 60.4 mL/min (A) (by C-G formula based on SCr of 0.35 mg/dL (L)). Liver Function Tests:  Recent Labs Lab 01/06/17 1154 01/07/17 0546 01/08/17 0809 01/09/17 0443 01/10/17 0655  AST 57* 62* 88* 102* 119*  ALT 42 43 50 54 58*  ALKPHOS 51 49 52 51 49  BILITOT 1.6* 2.0* 2.4* 1.9* 1.6*  PROT 5.8* 6.2* 6.6 6.5 6.1*  ALBUMIN 2.8* 2.9* 3.3* 3.2* 2.9*   No results for input(s): LIPASE, AMYLASE in the last 168 hours.  Recent Labs Lab 01/06/17 1154 01/08/17 0809 01/09/17 0443 01/10/17 0655  AMMONIA 96* 102* 59* 116*   Coagulation Profile:  Recent Labs Lab 01/06/17 1154  INR 1.36   Cardiac Enzymes: No results for input(s): CKTOTAL, CKMB, CKMBINDEX, TROPONINI in the last 168 hours. BNP (last 3 results) No results for input(s): PROBNP in the last 8760 hours. HbA1C:  Recent Labs  01/08/17 0809  HGBA1C 11.5*   CBG:  Recent Labs Lab 01/09/17 1623 01/09/17 2038 01/10/17 0754 01/10/17 1143 01/10/17 1632  GLUCAP 308* 285* 113* 284* 240*   Lipid Profile: No results for input(s): CHOL, HDL, LDLCALC, TRIG, CHOLHDL, LDLDIRECT in the last 72 hours. Thyroid Function Tests: No results for input(s): TSH,  T4TOTAL, FREET4, T3FREE, THYROIDAB in the last 72 hours. Anemia Panel: No results for input(s): VITAMINB12, FOLATE, FERRITIN, TIBC, IRON, RETICCTPCT in the last 72 hours. Sepsis Labs: No results for input(s): PROCALCITON, LATICACIDVEN in the last 168 hours.  No results found for this or any previous visit (from the past 240 hour(s)).   Radiology Studies: No results found.  Scheduled Meds: . insulin aspart  0-5 Units Subcutaneous QHS  . insulin aspart  0-9 Units Subcutaneous TID WC  . insulin aspart  5 Units Subcutaneous TID WC  . insulin NPH Human  20 Units Subcutaneous BID AC & HS  . lactulose  30 g Oral TID  . levothyroxine  112 mcg Oral QAC breakfast  . lisinopril  5 mg Oral Daily  . propranolol  10 mg Oral Daily  . sodium chloride flush  3 mL Intravenous Q12H   Continuous Infusions:   LOS: 2 days   Mckinnley Smithey, Scheryl Marten, MD [facility] Pager [phone]  If 7PM-7AM, please contact night-coverage [email or web address] Password [email or web address] 01/10/2017, 4:50 PM

## 2017-01-11 LAB — COMPREHENSIVE METABOLIC PANEL
ALK PHOS: 48 U/L (ref 38–126)
ALT: 64 U/L — AB (ref 14–54)
AST: 129 U/L — ABNORMAL HIGH (ref 15–41)
Albumin: 2.9 g/dL — ABNORMAL LOW (ref 3.5–5.0)
Anion gap: 9 (ref 5–15)
BILIRUBIN TOTAL: 1.5 mg/dL — AB (ref 0.3–1.2)
BUN: 9 mg/dL (ref 6–20)
CALCIUM: 8.6 mg/dL — AB (ref 8.9–10.3)
CO2: 26 mmol/L (ref 22–32)
CREATININE: 0.47 mg/dL (ref 0.44–1.00)
Chloride: 106 mmol/L (ref 101–111)
Glucose, Bld: 127 mg/dL — ABNORMAL HIGH (ref 65–99)
Potassium: 3.7 mmol/L (ref 3.5–5.1)
Sodium: 141 mmol/L (ref 135–145)
TOTAL PROTEIN: 5.8 g/dL — AB (ref 6.5–8.1)

## 2017-01-11 LAB — GLUCOSE, CAPILLARY
GLUCOSE-CAPILLARY: 124 mg/dL — AB (ref 65–99)
Glucose-Capillary: 387 mg/dL — ABNORMAL HIGH (ref 65–99)
Glucose-Capillary: 305 mg/dL — ABNORMAL HIGH (ref 65–99)
Glucose-Capillary: 282 mg/dL — ABNORMAL HIGH (ref 65–99)

## 2017-01-11 LAB — AMMONIA: AMMONIA: 71 umol/L — AB (ref 9–35)

## 2017-01-11 MED ORDER — INSULIN NPH (HUMAN) (ISOPHANE) 100 UNIT/ML ~~LOC~~ SUSP
30.0000 [IU] | Freq: Two times a day (BID) | SUBCUTANEOUS | Status: DC
  Administered 2017-01-11 – 2017-01-12 (×2): 30 [IU] via SUBCUTANEOUS

## 2017-01-11 MED ORDER — SODIUM CHLORIDE 0.9 % IV BOLUS (SEPSIS)
250.0000 mL | Freq: Once | INTRAVENOUS | Status: AC
  Administered 2017-01-11: 250 mL via INTRAVENOUS

## 2017-01-11 MED ORDER — ALBUMIN HUMAN 25 % IV SOLN
25.0000 g | Freq: Four times a day (QID) | INTRAVENOUS | Status: DC | PRN
  Filled 2017-01-11: qty 100

## 2017-01-11 MED ORDER — LACTULOSE 10 GM/15ML PO SOLN
30.0000 g | Freq: Four times a day (QID) | ORAL | Status: DC
  Administered 2017-01-11 – 2017-01-12 (×5): 30 g via ORAL
  Filled 2017-01-11 (×5): qty 45

## 2017-01-11 MED ORDER — ALBUMIN HUMAN 25 % IV SOLN
25.0000 g | Freq: Once | INTRAVENOUS | Status: AC
  Administered 2017-01-11: 25 g via INTRAVENOUS
  Filled 2017-01-11: qty 100

## 2017-01-11 NOTE — Progress Notes (Signed)
PROGRESS NOTE    Kimberly [name]  TRV:[id number] DOB: Dec 11, 1940 DOA: 01/06/2017 PCP: Patient, No Pcp Per    Brief Narrative:  76 y.o. female with medical history significant of non-alcoholic cirrhosis, diabetes mellitus, hypertension, thrombocytopenia, portal hypertension, esophageal varicies, hypothyroisism. Patient presented to the ED secondary to altered mental status. Patient does not know why she is in the hospital secondary to acute mental status change. Per daughter, this morning, the patient was acting more confused than usual. She has had a history of memory loss recently, for which she is setting up follow-up with neurology as an outpatient. Lately, the patient's daughter has been giving lactulose 65ml multiple times per day to treat acute mental status changes from hepatic encephalopathy, which usually works  Assessment & Plan:   Principal Problem:   Acute hepatic encephalopathy Active Problems:   Cirrhosis (HCC)   Uncontrolled type 2 diabetes mellitus with hyperglycemia (HCC)   Portal hypertension (HCC)   Hyperbilirubinemia   Esophageal varices (HCC)   Atelectasis   Essential hypertension   Hypothyroidism, unspecified   Hepatic encephalopathy (HCC)  Altered mental status Likely acute hepatic encephalopathy. Presenting ammonia of 96. Patient has been taking her lactulose per daughter's report.  -Currently on lactulose 20mg  TID -Ammonia appears improved. Patient still seems somewhat confused and encephalopathic -Will increase lactulose to 30 mg by mouth 4 times daily  Cirrhosis with Portal hypertension, Hyperbilirubinemia, Esophageal varicies Sees [facility] as an outpatient. Takes propranolol 20mg  daily -reduced propranolol to 10mg  -lactulose will be continued as per above -Patient hypotensive this morning. Propranolol held  Insulin dependent diabetes mellitus Hemoglobin A1C of 10.3 on 12/12/16. Per recent endocrinology note, patient switched to NPH secondary to cost -SSI  sensitive -Glucose remains in the 200's -continued on 5 units of pre-meal coverage -Glucose into the 300's. NPH will be increased to 30 units twice a day  Lung infiltrate Suspect atelectasis as patient has no symptoms of pneumonia -Continue with incentive spirometer -Remained stable  Essential hypertension -Patient had been continued on lisinopril and propranolol -Propranolol dose decreased secondary to mild bradycardia, asymptomatic -Given relative hypotension, will hold lisinopril. Would continue propranolol as tolerated  Bradycardia -reduce propranolol to 10mg  -stable at this time  Thrombocytopenia Secondary to chronic liver disease. Stable. -Stable at present  Hypothyroidism -continue Synthroid daily, tolerating -Presently stable  DVT prophylaxis: SCDs Code Status: DO NOT RESUSCITATE Family Communication: Patient in room, family not at bedside Disposition Plan: Uncertain at this time  Consultants:     Procedures:     Antimicrobials: Anti-infectives    None      Subjective: Denies abdominal pain, chest pain, shortness of breath.  Objective: Vitals:   01/11/17 0920 01/11/17 1112 01/11/17 1205 01/11/17 1457  BP: (!) 84/48 (!) 95/49 (!) 100/54 (!) 141/46  Pulse:  (!) 57 60 61  Resp:    19  Temp:    97.8 F (36.6 C)  TempSrc:    Oral  SpO2:    100%  Weight:      Height:        Intake/Output Summary (Last 24 hours) at 01/11/17 1508 Last data filed at 01/11/17 1230  Gross per 24 hour  Intake              480 ml  Output                0 ml  Net              480 ml   [employer]  01/06/17 1101 01/06/17 1637  Weight: 68 kg (150 lb) 76.5 kg (168 lb 10.4 oz)    Examination: General exam: Conversant, in no acute distress Respiratory system: normal chest rise, clear, no audible wheezing Cardiovascular system: regular rhythm, s1-s2 Gastrointestinal system: Nondistended, nontender, pos BS Central nervous system: No seizures, no  tremors Extremities: No cyanosis, no joint deformities Skin: No rashes, no pallor Psychiatry: Affect normal // no auditory hallucinations   Data Reviewed: I have personally reviewed following labs and imaging studies  CBC:  Recent Labs Lab 01/06/17 1154 01/07/17 0546 01/08/17 0809 01/10/17 0655  WBC 3.3* 4.0 4.9 4.5  NEUTROABS 1.7  --   --   --   HGB 12.6 14.1 14.0 13.3  HCT 37.2 41.1 40.6 38.1  MCV 90.1 90.5 90.0 90.9  PLT 81* 80* 95* 95*   Basic Metabolic Panel:  Recent Labs Lab 01/07/17 0546 01/08/17 0809 01/09/17 0443 01/10/17 0655 01/11/17 0546  NA 139 139 140 141 141  K 3.4* 3.6 3.8 3.9 3.7  CL 106 105 107 107 106  CO2 27 26 26 26 26   GLUCOSE 224* 167* 112* 101* 127*  BUN 6 8 11 9 9   CREATININE 0.46 0.51 0.56 0.35* 0.47  CALCIUM 8.3* 8.7* 9.0 8.6* 8.6*   GFR: Estimated Creatinine Clearance: 60.4 mL/min (by C-G formula based on SCr of 0.47 mg/dL). Liver Function Tests:  Recent Labs Lab 01/07/17 0546 01/08/17 0809 01/09/17 0443 01/10/17 0655 01/11/17 0546  AST 62* 88* 102* 119* 129*  ALT 43 50 54 58* 64*  ALKPHOS 49 52 51 49 48  BILITOT 2.0* 2.4* 1.9* 1.6* 1.5*  PROT 6.2* 6.6 6.5 6.1* 5.8*  ALBUMIN 2.9* 3.3* 3.2* 2.9* 2.9*   No results for input(s): LIPASE, AMYLASE in the last 168 hours.  Recent Labs Lab 01/06/17 1154 01/08/17 0809 01/09/17 0443 01/10/17 0655 01/11/17 0546  AMMONIA 96* 102* 59* 116* 71*   Coagulation Profile:  Recent Labs Lab 01/06/17 1154  INR 1.36   Cardiac Enzymes: No results for input(s): CKTOTAL, CKMB, CKMBINDEX, TROPONINI in the last 168 hours. BNP (last 3 results) No results for input(s): PROBNP in the last 8760 hours. HbA1C: No results for input(s): HGBA1C in the last 72 hours. CBG:  Recent Labs Lab 01/10/17 1143 01/10/17 1632 01/10/17 2103 01/11/17 0749 01/11/17 1128  GLUCAP 284* 240* 286* 124* 387*   Lipid Profile: No results for input(s): CHOL, HDL, LDLCALC, TRIG, CHOLHDL, LDLDIRECT in the  last 72 hours. Thyroid Function Tests: No results for input(s): TSH, T4TOTAL, FREET4, T3FREE, THYROIDAB in the last 72 hours. Anemia Panel: No results for input(s): VITAMINB12, FOLATE, FERRITIN, TIBC, IRON, RETICCTPCT in the last 72 hours. Sepsis Labs: No results for input(s): PROCALCITON, LATICACIDVEN in the last 168 hours.  No results found for this or any previous visit (from the past 240 hour(s)).   Radiology Studies: No results found.  Scheduled Meds: . insulin aspart  0-5 Units Subcutaneous QHS  . insulin aspart  0-9 Units Subcutaneous TID WC  . insulin aspart  5 Units Subcutaneous TID WC  . insulin NPH Human  25 Units Subcutaneous BID AC & HS  . lactulose  30 g Oral QID  . levothyroxine  112 mcg Oral QAC breakfast  . lisinopril  5 mg Oral Daily  . propranolol  10 mg Oral Daily  . sodium chloride flush  3 mL Intravenous Q12H   Continuous Infusions:   LOS: 3 days   Michal Strzelecki, Scheryl Marten, MD [facility] Pager [phone]  If 7PM-7AM, please contact night-coverage [email or web address] Password [email or web address] 01/11/2017, 3:08 PM

## 2017-01-11 NOTE — Clinical Social Work Note (Signed)
Clinical Social Work Assessment  Patient Details  Name: Kimberly [name] MRN: [id number] Date of Birth: 01/25/1941  Date of referral:  01/11/17               Reason for consult:  Facility Placement                Permission sought to share information with:  Facility Contact Representative Permission granted to share information::  Yes, Verbal Permission Granted  Name::     Beth   Agency::  SNF-no  Relationship::  Daughter  Contact Information:     Housing/Transportation Living arrangements for the past 2 months:  Single Family Home Source of Information:  Patient Patient Interpreter Needed:  None Criminal Activity/Legal Involvement Pertinent to Current Situation/Hospitalization:    Significant Relationships:  Adult Children Lives with:  Adult Children Do you feel safe going back to the place where you live?  Yes Need for family participation in patient care:  Yes (Comment)  Care giving concerns:  Patient resided at home prior to hospitalization with daughter. CSW unable to make contact with daughther to discuss DC recommendations for SNF.  Patient indicated that she was returning home when DC.  CSW will need to discuss with daughter to confirm.  Will need FL2 & passr if patient changes mind for SNF and contact is made with daughter Beth.  Social Worker assessment / plan:  CSW met with patient at bedside however patient lethargic and gave permission to speak with daughter. CSW unable to reach daughter. Patient declined SNF and indicated she was returning home at DC.  Employment status:  Retired Insurance information:  Managed Care PT Recommendations:  Skilled Nursing Facility Information / Referral to community resources:  Skilled Nursing Facility  Patient/Family's Response to care:  Deferred at this time as CSW unable to reach daughter Beth  Patient/Family's Understanding of and Emotional Response to Diagnosis, Current Treatment, and Prognosis:  Deferred at this time, Patient  appeared lethargic.  Emotional Assessment Appearance:  Appears stated age Attitude/Demeanor/Rapport:   (Cooperative Somewhat, seems lethargic) Affect (typically observed):  Accepting, Calm Orientation:  Oriented to Place, Oriented to Self Alcohol / Substance use:  Not Applicable Psych involvement (Current and /or in the community):  No (Comment)  Discharge Needs  Concerns to be addressed:  Care Coordination Readmission within the last 30 days:  No Current discharge risk:    Barriers to Discharge:  No Barriers Identified    V , LCSW 01/11/2017, 1:20 PM  

## 2017-01-11 NOTE — Progress Notes (Signed)
Pt BP 84/48 HR 59. Pt asymptomatic.MD notified. New orders followed out. Will continue to monitor.
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
## 2017-01-12 DIAGNOSIS — E1165 Type 2 diabetes mellitus with hyperglycemia: Secondary | ICD-10-CM

## 2017-01-12 DIAGNOSIS — K766 Portal hypertension: Secondary | ICD-10-CM

## 2017-01-12 DIAGNOSIS — I8500 Esophageal varices without bleeding: Secondary | ICD-10-CM

## 2017-01-12 DIAGNOSIS — Z794 Long term (current) use of insulin: Secondary | ICD-10-CM

## 2017-01-12 DIAGNOSIS — I10 Essential (primary) hypertension: Secondary | ICD-10-CM

## 2017-01-12 DIAGNOSIS — E7220 Disorder of urea cycle metabolism, unspecified: Secondary | ICD-10-CM

## 2017-01-12 DIAGNOSIS — K7200 Acute and subacute hepatic failure without coma: Principal | ICD-10-CM

## 2017-01-12 DIAGNOSIS — K7460 Unspecified cirrhosis of liver: Secondary | ICD-10-CM

## 2017-01-12 LAB — CBC
HCT: 30.6 % — ABNORMAL LOW (ref 36.0–46.0)
HEMOGLOBIN: 10.4 g/dL — AB (ref 12.0–15.0)
MCH: 30.6 pg (ref 26.0–34.0)
MCHC: 34 g/dL (ref 30.0–36.0)
MCV: 90 fL (ref 78.0–100.0)
PLATELETS: 80 10*3/uL — AB (ref 150–400)
RBC: 3.4 MIL/uL — ABNORMAL LOW (ref 3.87–5.11)
RDW: 16.6 % — ABNORMAL HIGH (ref 11.5–15.5)
WBC: 3.5 10*3/uL — ABNORMAL LOW (ref 4.0–10.5)

## 2017-01-12 LAB — COMPREHENSIVE METABOLIC PANEL
ALBUMIN: 3.1 g/dL — AB (ref 3.5–5.0)
ALK PHOS: 41 U/L (ref 38–126)
ALT: 63 U/L — ABNORMAL HIGH (ref 14–54)
AST: 119 U/L — AB (ref 15–41)
Anion gap: 5 (ref 5–15)
BILIRUBIN TOTAL: 1.6 mg/dL — AB (ref 0.3–1.2)
BUN: 11 mg/dL (ref 6–20)
CALCIUM: 8.7 mg/dL — AB (ref 8.9–10.3)
CO2: 28 mmol/L (ref 22–32)
CREATININE: 0.45 mg/dL (ref 0.44–1.00)
Chloride: 107 mmol/L (ref 101–111)
GFR calc Af Amer: >60 mL/min (ref >60)
GFR calc non Af Amer: >60 mL/min (ref >60)
GLUCOSE: 133 mg/dL — AB (ref 65–99)
Potassium: 3.7 mmol/L (ref 3.5–5.1)
Sodium: 140 mmol/L (ref 135–145)
TOTAL PROTEIN: 5.5 g/dL — AB (ref 6.5–8.1)

## 2017-01-12 LAB — GLUCOSE, CAPILLARY
GLUCOSE-CAPILLARY: 180 mg/dL — AB (ref 65–99)
GLUCOSE-CAPILLARY: 358 mg/dL — AB (ref 65–99)

## 2017-01-12 LAB — AMMONIA: Ammonia: 45 umol/L — ABNORMAL HIGH (ref 9–35)

## 2017-01-12 MED ORDER — INSULIN DETEMIR 100 UNIT/ML FLEXPEN: 20.0000 [IU] | PEN_INJECTOR | Freq: Every day | SUBCUTANEOUS | 0 refills | Status: DC

## 2017-01-12 MED ORDER — PROPRANOLOL HCL 10 MG PO TABS: 20.0000 mg | ORAL_TABLET | Freq: Every day | ORAL | 0 refills | Status: DC

## 2017-01-12 MED ORDER — LACTULOSE 10 GM/15ML PO SOLN: 30.0000 g | Freq: Three times a day (TID) | ORAL | 1 refills | Status: DC

## 2017-01-12 MED ORDER — INSULIN ISOPHANE HUMAN 100 UNIT/ML KWIKPEN: 20.0000 [IU] | PEN_INJECTOR | Freq: Two times a day (BID) | SUBCUTANEOUS | 1 refills | Status: DC

## 2017-01-12 MED ORDER — PROPRANOLOL HCL 10 MG PO TABS: 10.0000 mg | ORAL_TABLET | Freq: Every day | ORAL | 0 refills | Status: DC

## 2017-01-12 NOTE — Progress Notes (Signed)
Per previous CSW note patient declined SNF and unable to reach patient's daughter. CSW informed by [facility] that patient is discharging home. CSW signing off, no other needs identified at this time. Please consult if new needs arise.   Celso Sickle, [place] Clinical Social Worker [facility] Cell#: [phone]

## 2017-01-12 NOTE — Progress Notes (Signed)
Discharge instructions given to patient and patients daughter , Kimberly [name].  Both, patient and Beth verbalized understanding of all instruction.

## 2017-01-12 NOTE — Discharge Summary (Addendum)
Physician Discharge Summary  Kimberly [name] ZOX:[id number] DOB: 1940-12-27 DOA: 01/06/2017  PCP: Patient, No Pcp Per  Admit date: 01/06/2017 Discharge date: 01/12/2017  Admitted From:  Home Disposition:  Home   Recommendations for Outpatient Follow-up:  1. Follow up with PCP in 1-2 weeks 2. Please obtain BMP/CBC in one week   Home Health: YES Equipment/Devices:HHPT  Discharge Condition: Stable CODE STATUS:FULL Diet recommendation: low sodium   Brief/Interim Summary: 76 y.o.femalewith medical history significant of non-alcoholic cirrhosis, diabetes mellitus, hypertension, thrombocytopenia, portal hypertension, esophageal varicies, hypothyroisism. Patient presented to the ED secondary to altered mental status. Patient does not know why she is in the hospital secondary to acute mental status change. Per daughter,on the morning of admission, the patient was acting more confused than usual. She has had a history of memory loss recently, for which she is setting up follow-up with neurology as an outpatient. Lately, the patient's daughter has been giving lactulose 15ml twice per day to treat acute mental status changes from hepatic encephalopathy, which usually works  Discharge Diagnoses:  Acute hepatic encephalopathy -Presenting ammonia of 96. Patient has been taking her lactulose per daughter's report.  -ammonia peaked 102>>>45 -D/C home on lactulose 30mg  TID with goal of 2 BMs daily -Ammonia appears improved. -mental status improving -recheck CMP and ammonia in one week -stop pramipexole as this can increase confusion  Cirrhosis with Portal hypertension, Hyperbilirubinemia, Esophageal varicies -Sees [facility] an outpatient. Takes propranolol 20mg  daily -reduced propranolol to 10mg  due to soft BPs and intermittent hypotension during the admission -lactulose will be continued as per above  Insulin dependent diabetes mellitus -01/08/17 A1C--11.5--Per recent endocrinology note, patient  switched to NPH secondary to cost -SSI sensitive -continued on 5 units of pre-meal coverage -Glucose into the 300's. 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 Stable. -Stable at present  Hypothyroidism -continue Synthroid daily, tolerating -Presently stable    Discharge Instructions  Discharge Instructions    Diet - low sodium heart healthy    Complete by:  As directed    Increase activity slowly    Complete by:  As directed      Allergies as of 01/12/2017      Reactions   Aspirin Other (See Comments)   Atorvastatin    Other reaction(s): MYALGIA   Codeine    nausea   Levothyroxine    Other reaction(s): PALPITATIONS   Pravastatin Other (See Comments)   Propoxyphene       Medication List    STOP taking these medications   HUMULIN N KWIKPEN 100 UNIT/ML Kiwkpen Generic drug:  Insulin NPH (Human) (Isophane)   HYDROcodone-acetaminophen 5-325 MG tablet Commonly known as:  NORCO   lisinopril 5 MG tablet Commonly known as:  PRINIVIL,ZESTRIL   pramipexole 0.5 MG tablet Commonly known as:  MIRAPEX   TYLENOL ARTHRITIS PAIN 650 MG CR tablet Generic drug:  acetaminophen     TAKE these medications   Insulin Detemir 100 UNIT/ML Pen Commonly known as:  LEVEMIR Inject 20 Units into the skin daily at 10 pm.  insulin regular 100 units/mL  injection Commonly known as:  NOVOLIN R,HUMULIN R Inject into the skin 3 (three) times daily before meals. Use sliding scale provided by physician to administer insulin before meals as needed   lactulose 10 GM/15ML solution Commonly known as:  CHRONULAC Take 45 mLs (30 g total) by mouth 3 (three) times daily. What changed:  how much to take  when to take this   propranolol 10 MG tablet Commonly known as:  INDERAL Take 1 tablet (10 mg total) by mouth daily. What changed:  medication strength  how much to take   SYNTHROID 112 MCG tablet Generic drug:  levothyroxine Take 112 mcg by mouth daily before breakfast.      Follow-up Information    [facility] Follow up.   Why:  HH nursing,physical/occupational Social worker information: [address] [place] [address] [phone]          Allergies  Allergen Reactions  . Aspirin Other (See Comments)  . Atorvastatin     Other reaction(s): MYALGIA  . Codeine     nausea  . Levothyroxine     Other reaction(s): PALPITATIONS  . Pravastatin Other (See Comments)  . Propoxyphene     Consultations:  none   Procedures/Studies: Dg Chest 2 View  Result Date: 01/06/2017 CLINICAL DATA:  76 year old presenting with acute confusion. Current history of diabetes and hypertension. Former smoker. EXAM: CHEST  2 VIEW COMPARISON:  04/21/2016. FINDINGS: AP semi-erect and lateral images were obtained. Suboptimal inspiration accounts for crowded bronchovascular markings, especially in the bases, and accentuates the cardiac silhouette. Taking this into account, cardiac silhouette upper normal in size, unchanged. Thoracic aorta atherosclerotic, unchanged. Hilar and mediastinal contours otherwise unremarkable. Streaky and patchy opacities in the medial right lower lobe. Lungs otherwise clear. No pleural effusions. No pneumothorax. Normal pulmonary vascularity. Likely old healed mildly impacted  fracture of the left humeral neck. Degenerative changes involving the thoracic spine. IMPRESSION: Suboptimal inspiration. Atelectasis versus bronchopneumonia involving the medial right lower lobe. Electronically Signed   By: Hulan Saas M.D.   On: 01/06/2017 11:50   Ct Head Wo Contrast  Result Date: 01/06/2017 CLINICAL DATA:  Altered mental status/confusion EXAM: CT HEAD WITHOUT CONTRAST TECHNIQUE: Contiguous axial images were obtained from the base of the skull through the vertex without intravenous contrast. COMPARISON:  None. FINDINGS: Brain: There is age related volume loss. There is no intracranial mass, hemorrhage, extra-axial fluid collection, or midline shift. There is patchy small vessel disease in the centra semiovale bilaterally. Elsewhere gray-white compartments appear normal. There is no evident acute infarct. Vascular: There is no hyperdense vessel appreciable. There are foci of calcification in each carotid siphon region. Skull: Bony calvarium appears intact. Sinuses/Orbits: There is a small benign osteoma in the posterior right ethmoid air cell complex medially. There is slight mucosal thickening of several ethmoid air cells. Visualized paranasal sinuses elsewhere are clear. Orbits appear symmetric bilaterally. Other: Mastoid air cells are clear. IMPRESSION: Age related volume loss with patchy periventricular small vessel disease. No intracranial mass, hemorrhage, extra-axial fluid collection. No acute appearing infarct. Foci of calcification in the carotid siphon regions. There is mild ethmoid sinus disease bilaterally. Electronically Signed   By: Bretta Bang III M.D.   On: 01/06/2017 11:46        Discharge Exam: Vitals:   01/12/17 0446 01/12/17 0916  BP: (!) 119/51   Pulse: (!) 51 66  Resp: 18   Temp: 98 F (36.7 C)  Vitals:   01/12/17 0046 01/12/17 0248 01/12/17 0446 01/12/17 0916  BP: 113/60 (!) 112/53 (!) 119/51   Pulse: 65 64 (!) 51 66  Resp: 16 16 18     Temp: 97.5 F (36.4 C) 98 F (36.7 C) 98 F (36.7 C)   TempSrc: Oral Oral Oral   SpO2: 97% 97% 97%   Weight:      Height:        General: Pt is alert, awake, not in acute distress Cardiovascular: RRR, S1/S2 +, no rubs, no gallops Respiratory: Bibasilar crackles. No wheezing. Good air movement Abdominal: Soft, NT, ND, bowel sounds + Extremities: no edema, no cyanosis   The results of significant diagnostics from this hospitalization (including imaging, microbiology, ancillary and laboratory) are listed below for reference.    Significant Diagnostic Studies: Dg Chest 2 View  Result Date: 01/06/2017 CLINICAL DATA:  76 year old presenting with acute confusion. Current history of diabetes and hypertension. Former smoker. EXAM: CHEST  2 VIEW COMPARISON:  04/21/2016. FINDINGS: AP semi-erect and lateral images were obtained. Suboptimal inspiration accounts for crowded bronchovascular markings, especially in the bases, and accentuates the cardiac silhouette. Taking this into account, cardiac silhouette upper normal in size, unchanged. Thoracic aorta atherosclerotic, unchanged. Hilar and mediastinal contours otherwise unremarkable. Streaky and patchy opacities in the medial right lower lobe. Lungs otherwise clear. No pleural effusions. No pneumothorax. Normal pulmonary vascularity. Likely old healed mildly impacted fracture of the left humeral neck. Degenerative changes involving the thoracic spine. IMPRESSION: Suboptimal inspiration. Atelectasis versus bronchopneumonia involving the medial right lower lobe. Electronically Signed   By: Hulan Saas M.D.   On: 01/06/2017 11:50   Ct Head Wo Contrast  Result Date: 01/06/2017 CLINICAL DATA:  Altered mental status/confusion EXAM: CT HEAD WITHOUT CONTRAST TECHNIQUE: Contiguous axial images were obtained from the base of the skull through the vertex without intravenous contrast. COMPARISON:  None. FINDINGS: Brain: There is age related volume loss. There  is no intracranial mass, hemorrhage, extra-axial fluid collection, or midline shift. There is patchy small vessel disease in the centra semiovale bilaterally. Elsewhere gray-white compartments appear normal. There is no evident acute infarct. Vascular: There is no hyperdense vessel appreciable. There are foci of calcification in each carotid siphon region. Skull: Bony calvarium appears intact. Sinuses/Orbits: There is a small benign osteoma in the posterior right ethmoid air cell complex medially. There is slight mucosal thickening of several ethmoid air cells. Visualized paranasal sinuses elsewhere are clear. Orbits appear symmetric bilaterally. Other: Mastoid air cells are clear. IMPRESSION: Age related volume loss with patchy periventricular small vessel disease. No intracranial mass, hemorrhage, extra-axial fluid collection. No acute appearing infarct. Foci of calcification in the carotid siphon regions. There is mild ethmoid sinus disease bilaterally. Electronically Signed   By: Bretta Bang III M.D.   On: 01/06/2017 11:46     Microbiology: No results found for this or any previous visit (from the past 240 hour(s)).   Labs: Basic Metabolic Panel:  Recent Labs Lab 01/08/17 0809 01/09/17 0443 01/10/17 0655 01/11/17 0546 01/12/17 0605  NA 139 140 141 141 140  K 3.6 3.8 3.9 3.7 3.7  CL 105 107 107 106 107  CO2 26 26 26 26 28   GLUCOSE 167* 112* 101* 127* 133*  BUN 8 11 9 9 11   CREATININE 0.51 0.56 0.35* 0.47 0.45  CALCIUM 8.7* 9.0 8.6* 8.6* 8.7*   Liver Function Tests:  Recent Labs Lab 01/08/17 0809 01/09/17 0443 01/10/17 0655 01/11/17 0546 01/12/17 0605  AST 88* 102* 119*  129* 119*  ALT 50 54 58* 64* 63*  ALKPHOS 52 51 49 48 41  BILITOT 2.4* 1.9* 1.6* 1.5* 1.6*  PROT 6.6 6.5 6.1* 5.8* 5.5*  ALBUMIN 3.3* 3.2* 2.9* 2.9* 3.1*   No results for input(s): LIPASE, AMYLASE in the last 168 hours.  Recent Labs Lab 01/08/17 0809 01/09/17 0443 01/10/17 0655 01/11/17 0546  01/12/17 0605  AMMONIA 102* 59* 116* 71* 45*   CBC:  Recent Labs Lab 01/06/17 1154 01/07/17 0546 01/08/17 0809 01/10/17 0655 01/12/17 0605  WBC 3.3* 4.0 4.9 4.5 3.5*  NEUTROABS 1.7  --   --   --   --   HGB 12.6 14.1 14.0 13.3 10.4*  HCT 37.2 41.1 40.6 38.1 30.6*  MCV 90.1 90.5 90.0 90.9 90.0  PLT 81* 80* 95* 95* 80*   Cardiac Enzymes: No results for input(s): CKTOTAL, CKMB, CKMBINDEX, TROPONINI in the last 168 hours. BNP: Invalid input(s): POCBNP CBG:  Recent Labs Lab 01/11/17 1128 01/11/17 1630 01/11/17 2016 01/12/17 0751 01/12/17 1118  GLUCAP 387* 305* 282* 180* 358*    Time coordinating discharge:  Greater than 30 minutes  Signed:  Flara Storti, DO [facility] Pager: [phone] 01/12/2017, 1:18 PM

## 2017-01-12 NOTE — Care Management Note (Signed)
Case Management Note  Patient Details  Name: Kimberly [name] MRN: [id number] Date of Birth: 08/21/1940  Subjective/Objective: Recc for HHC-HHRN/PT/OT/aide/social worker-TC dtr Waynetta Sandy per patient request tel#[phone]-unable to leave message to offer HHC choice-will try again to contact dtr, will also leave [facility] agency list in rm. HHC orders placed.                   Action/Plan:d/c home w/HHC.   Expected Discharge Date:  01/12/17               Expected Discharge Plan:  Home w Home Health Services  In-House Referral:     Discharge planning Services  CM Consult  Post Acute Care Choice:    Choice offered to:  Adult Children  DME Arranged:    DME Agency:     HH Arranged:  RN, PT, OT, Nurse's Aide, Social Work [employer] Agency:     Status of Service:  In process, will continue to follow  If discussed at Long Length of Stay Meetings, dates discussed:    Additional Comments:  Lanier Clam, RN 01/12/2017, 11:10 AM

## 2017-01-12 NOTE — Care Management Note (Signed)
Case Management Note  Patient Details  Name: [name] MRN: [id number] Date of Birth: 12-02-1940  Subjective/Objective:  Since unable to reach dtr-even tried calling from patient's tle-unable to leave message. Patient chose [facility] for HHC-rep-Kim aware of HHC orders,& d/c today.                  Action/Plan:d/c home w/HHC.   Expected Discharge Date:  01/12/17               Expected Discharge Plan:  Home w Home Health Services  In-House Referral:     Discharge planning Services  CM Consult  Post Acute Care Choice:    Choice offered to:  Adult Children  DME Arranged:    DME Agency:     HH Arranged:  RN, PT, OT, Nurse's Aide, Social Work [employer] Agency:  [facility]  Status of Service:  Completed, signed off  If discussed at [employer], dates discussed:    Additional Comments:  Lanier Clam, RN 01/12/2017, 11:24 AM

## 2017-01-12 NOTE — Progress Notes (Addendum)
Physical Therapy Treatment Patient Details Name: Kimberly [name] MRN: [id number] DOB: 1941-07-29 Today's Date: 01/12/2017    History of Present Illness  76 y.o. female with medical history significant of non-alcoholic cirrhosis, diabetes mellitus, hypertension, thrombocytopenia, portal hypertension, esophageal varicies, hypothyroisism. Patient presented to the ED secondary to altered mental status.    PT Comments    Upon PT's arrival, pt was sitting in a completely urine soaked bed. When asked why she didn't call nursing for assistance to bathroom, pt stated "no need." Pt walked ~115 feet with a RW. She continues to require cues for safety and attention to environment. No family present during session. Pt will absolutely require 24 hour supervision if she returns home. If she does not have the appropriate level of supervision, recommend SNF.     Follow Up Recommendations  Home health PT;Supervision/Assistance - 24 hour vs SNF     Equipment Recommendations  None recommended by PT    Recommendations for Other Services       Precautions / Restrictions Precautions Precautions: Fall Precaution Comments: reports several falls in past year, can't recall how many Restrictions Weight Bearing Restrictions: No    Mobility  Bed Mobility Overal bed mobility: Needs Assistance Bed Mobility: Supine to Sit     Supine to sit: Supervision     General bed mobility comments: for safety  Transfers Overall transfer level: Needs assistance Equipment used: Rolling walker (2 wheeled) Transfers: Sit to/from [employer] Transfers Sit to Stand: Supervision Stand pivot transfers: Supervision       General transfer comment: for safety  Ambulation/Gait Ambulation/Gait assistance: Min guard Ambulation Distance (Feet): 115 Feet Assistive device: Rolling walker (2 wheeled) Gait Pattern/deviations: Step-through pattern;Decreased stride length     General Gait Details: close guard for  safety. Pt intermittently steered into objects in environment. Cues for safety, attention to environment   Stairs            Wheelchair Mobility    Modified Rankin (Stroke Patients Only)       Balance                                            Cognition Arousal/Alertness: Awake/alert Behavior During Therapy: WFL for tasks assessed/performed Overall Cognitive Status: No family/caregiver present to determine baseline cognitive functioning                         Following Commands: Follows one step commands with increased time Safety/Judgement: Decreased awareness of safety   Problem Solving: Slow processing;Requires verbal cues        Exercises      General Comments        Pertinent Vitals/Pain Pain Assessment: No/denies pain    Home Living                      Prior Function            PT Goals (current goals can now be found in the care plan section) Progress towards PT goals: Progressing toward goals    Frequency    Min 3X/week      PT Plan Current plan remains appropriate    Co-evaluation              AM-PAC PT "6 Clicks" Daily Activity  Outcome Measure  Difficulty turning over in bed (including adjusting bedclothes,  sheets and blankets)?: None Difficulty moving from lying on back to sitting on the side of the bed? : None Difficulty sitting down on and standing up from a chair with arms (e.g., wheelchair, bedside commode, etc,.)?: A Little Help needed moving to and from a bed to chair (including a wheelchair)?: A Little Help needed walking in hospital room?: A Little Help needed climbing 3-5 steps with a railing? : A Little 6 Click Score: 20    End of Session Equipment Utilized During Treatment: Gait belt Activity Tolerance: Patient tolerated treatment well Patient left: in chair;with call bell/phone within reach;with chair alarm set   PT Visit Diagnosis: Muscle weakness (generalized)  (M62.81);Difficulty in walking, not elsewhere classified (R26.2)     Time: 7824-2353 PT Time Calculation (min) (ACUTE ONLY): 18 min  Charges:  $Gait Training: 8-22 mins                    G Codes:          Rebeca Alert, MPT Pager: [phone]

## 2017-01-12 NOTE — Care Management Important Message (Signed)
Important Message  Patient Details  Name: Kimberly [name] MRN: [id number] Date of Birth: 1941/08/03   Medicare Important Message Given:  Yes    Caren Macadam 01/12/2017, 10:00 AMImportant Message  Patient Details  Name: Kimberly [name] MRN: [id number] Date of Birth: 1940-10-25   Medicare Important Message Given:  Yes    Caren Macadam 01/12/2017, 10:00 AM

## 2017-01-16 DIAGNOSIS — K7469 Other cirrhosis of liver: Secondary | ICD-10-CM | POA: Diagnosis not present

## 2017-01-16 DIAGNOSIS — E039 Hypothyroidism, unspecified: Secondary | ICD-10-CM | POA: Diagnosis not present

## 2017-01-16 DIAGNOSIS — D696 Thrombocytopenia, unspecified: Secondary | ICD-10-CM | POA: Diagnosis not present

## 2017-01-16 DIAGNOSIS — I10 Essential (primary) hypertension: Secondary | ICD-10-CM | POA: Diagnosis not present

## 2017-01-16 DIAGNOSIS — E119 Type 2 diabetes mellitus without complications: Secondary | ICD-10-CM | POA: Diagnosis not present

## 2017-01-16 DIAGNOSIS — K769 Liver disease, unspecified: Secondary | ICD-10-CM | POA: Diagnosis not present

## 2017-01-16 DIAGNOSIS — I8500 Esophageal varices without bleeding: Secondary | ICD-10-CM | POA: Diagnosis not present

## 2017-01-16 DIAGNOSIS — Z794 Long term (current) use of insulin: Secondary | ICD-10-CM | POA: Diagnosis not present

## 2017-01-16 DIAGNOSIS — K7291 Hepatic failure, unspecified with coma: Secondary | ICD-10-CM | POA: Diagnosis not present

## 2017-01-16 DIAGNOSIS — R918 Other nonspecific abnormal finding of lung field: Secondary | ICD-10-CM | POA: Diagnosis not present

## 2017-01-20 DIAGNOSIS — K7469 Other cirrhosis of liver: Secondary | ICD-10-CM | POA: Diagnosis not present

## 2017-01-20 DIAGNOSIS — E119 Type 2 diabetes mellitus without complications: Secondary | ICD-10-CM | POA: Diagnosis not present

## 2017-01-20 DIAGNOSIS — I10 Essential (primary) hypertension: Secondary | ICD-10-CM | POA: Diagnosis not present

## 2017-01-20 DIAGNOSIS — K769 Liver disease, unspecified: Secondary | ICD-10-CM | POA: Diagnosis not present

## 2017-01-20 DIAGNOSIS — Z794 Long term (current) use of insulin: Secondary | ICD-10-CM | POA: Diagnosis not present

## 2017-01-20 DIAGNOSIS — K7030 Alcoholic cirrhosis of liver without ascites: Secondary | ICD-10-CM | POA: Diagnosis not present

## 2017-01-20 DIAGNOSIS — R918 Other nonspecific abnormal finding of lung field: Secondary | ICD-10-CM | POA: Diagnosis not present

## 2017-01-20 DIAGNOSIS — E039 Hypothyroidism, unspecified: Secondary | ICD-10-CM | POA: Diagnosis not present

## 2017-01-20 DIAGNOSIS — I8500 Esophageal varices without bleeding: Secondary | ICD-10-CM | POA: Diagnosis not present

## 2017-01-20 DIAGNOSIS — D696 Thrombocytopenia, unspecified: Secondary | ICD-10-CM | POA: Diagnosis not present

## 2017-01-20 DIAGNOSIS — K7291 Hepatic failure, unspecified with coma: Secondary | ICD-10-CM | POA: Diagnosis not present

## 2017-01-21 DIAGNOSIS — K7290 Hepatic failure, unspecified without coma: Secondary | ICD-10-CM | POA: Diagnosis not present

## 2017-01-21 DIAGNOSIS — K766 Portal hypertension: Secondary | ICD-10-CM | POA: Diagnosis not present

## 2017-01-21 DIAGNOSIS — K7581 Nonalcoholic steatohepatitis (NASH): Secondary | ICD-10-CM | POA: Diagnosis not present

## 2017-01-21 DIAGNOSIS — K7460 Unspecified cirrhosis of liver: Secondary | ICD-10-CM | POA: Diagnosis not present

## 2017-01-26 DIAGNOSIS — R918 Other nonspecific abnormal finding of lung field: Secondary | ICD-10-CM | POA: Diagnosis not present

## 2017-01-26 DIAGNOSIS — I10 Essential (primary) hypertension: Secondary | ICD-10-CM | POA: Diagnosis not present

## 2017-01-26 DIAGNOSIS — E039 Hypothyroidism, unspecified: Secondary | ICD-10-CM | POA: Diagnosis not present

## 2017-01-26 DIAGNOSIS — K7469 Other cirrhosis of liver: Secondary | ICD-10-CM | POA: Diagnosis not present

## 2017-01-26 DIAGNOSIS — K769 Liver disease, unspecified: Secondary | ICD-10-CM | POA: Diagnosis not present

## 2017-01-26 DIAGNOSIS — K7291 Hepatic failure, unspecified with coma: Secondary | ICD-10-CM | POA: Diagnosis not present

## 2017-01-26 DIAGNOSIS — D696 Thrombocytopenia, unspecified: Secondary | ICD-10-CM | POA: Diagnosis not present

## 2017-01-26 DIAGNOSIS — I8500 Esophageal varices without bleeding: Secondary | ICD-10-CM | POA: Diagnosis not present

## 2017-01-26 DIAGNOSIS — Z794 Long term (current) use of insulin: Secondary | ICD-10-CM | POA: Diagnosis not present

## 2017-01-26 DIAGNOSIS — E119 Type 2 diabetes mellitus without complications: Secondary | ICD-10-CM | POA: Diagnosis not present

## 2017-01-28 DIAGNOSIS — D696 Thrombocytopenia, unspecified: Secondary | ICD-10-CM | POA: Diagnosis not present

## 2017-01-28 DIAGNOSIS — I10 Essential (primary) hypertension: Secondary | ICD-10-CM | POA: Diagnosis not present

## 2017-01-28 DIAGNOSIS — Z794 Long term (current) use of insulin: Secondary | ICD-10-CM | POA: Diagnosis not present

## 2017-01-28 DIAGNOSIS — I8500 Esophageal varices without bleeding: Secondary | ICD-10-CM | POA: Diagnosis not present

## 2017-01-28 DIAGNOSIS — E119 Type 2 diabetes mellitus without complications: Secondary | ICD-10-CM | POA: Diagnosis not present

## 2017-01-28 DIAGNOSIS — K7469 Other cirrhosis of liver: Secondary | ICD-10-CM | POA: Diagnosis not present

## 2017-01-28 DIAGNOSIS — K769 Liver disease, unspecified: Secondary | ICD-10-CM | POA: Diagnosis not present

## 2017-01-28 DIAGNOSIS — K7291 Hepatic failure, unspecified with coma: Secondary | ICD-10-CM | POA: Diagnosis not present

## 2017-01-28 DIAGNOSIS — E039 Hypothyroidism, unspecified: Secondary | ICD-10-CM | POA: Diagnosis not present

## 2017-01-28 DIAGNOSIS — R918 Other nonspecific abnormal finding of lung field: Secondary | ICD-10-CM | POA: Diagnosis not present

## 2017-01-29 DIAGNOSIS — K7469 Other cirrhosis of liver: Secondary | ICD-10-CM | POA: Diagnosis not present

## 2017-01-29 DIAGNOSIS — E119 Type 2 diabetes mellitus without complications: Secondary | ICD-10-CM | POA: Diagnosis not present

## 2017-01-29 DIAGNOSIS — K769 Liver disease, unspecified: Secondary | ICD-10-CM | POA: Diagnosis not present

## 2017-01-29 DIAGNOSIS — Z794 Long term (current) use of insulin: Secondary | ICD-10-CM | POA: Diagnosis not present

## 2017-01-29 DIAGNOSIS — I8500 Esophageal varices without bleeding: Secondary | ICD-10-CM | POA: Diagnosis not present

## 2017-01-29 DIAGNOSIS — R918 Other nonspecific abnormal finding of lung field: Secondary | ICD-10-CM | POA: Diagnosis not present

## 2017-01-29 DIAGNOSIS — I10 Essential (primary) hypertension: Secondary | ICD-10-CM | POA: Diagnosis not present

## 2017-01-29 DIAGNOSIS — D696 Thrombocytopenia, unspecified: Secondary | ICD-10-CM | POA: Diagnosis not present

## 2017-01-29 DIAGNOSIS — K7291 Hepatic failure, unspecified with coma: Secondary | ICD-10-CM | POA: Diagnosis not present

## 2017-01-29 DIAGNOSIS — E039 Hypothyroidism, unspecified: Secondary | ICD-10-CM | POA: Diagnosis not present

## 2017-01-30 DIAGNOSIS — K7291 Hepatic failure, unspecified with coma: Secondary | ICD-10-CM | POA: Diagnosis not present

## 2017-01-30 DIAGNOSIS — E119 Type 2 diabetes mellitus without complications: Secondary | ICD-10-CM | POA: Diagnosis not present

## 2017-01-30 DIAGNOSIS — K769 Liver disease, unspecified: Secondary | ICD-10-CM | POA: Diagnosis not present

## 2017-01-30 DIAGNOSIS — E039 Hypothyroidism, unspecified: Secondary | ICD-10-CM | POA: Diagnosis not present

## 2017-01-30 DIAGNOSIS — I8500 Esophageal varices without bleeding: Secondary | ICD-10-CM | POA: Diagnosis not present

## 2017-01-30 DIAGNOSIS — D696 Thrombocytopenia, unspecified: Secondary | ICD-10-CM | POA: Diagnosis not present

## 2017-01-30 DIAGNOSIS — R918 Other nonspecific abnormal finding of lung field: Secondary | ICD-10-CM | POA: Diagnosis not present

## 2017-01-30 DIAGNOSIS — I10 Essential (primary) hypertension: Secondary | ICD-10-CM | POA: Diagnosis not present

## 2017-01-30 DIAGNOSIS — K7469 Other cirrhosis of liver: Secondary | ICD-10-CM | POA: Diagnosis not present

## 2017-01-30 DIAGNOSIS — Z794 Long term (current) use of insulin: Secondary | ICD-10-CM | POA: Diagnosis not present

## 2017-02-01 DIAGNOSIS — E119 Type 2 diabetes mellitus without complications: Secondary | ICD-10-CM | POA: Diagnosis not present

## 2017-02-01 DIAGNOSIS — K7291 Hepatic failure, unspecified with coma: Secondary | ICD-10-CM | POA: Diagnosis not present

## 2017-02-01 DIAGNOSIS — D696 Thrombocytopenia, unspecified: Secondary | ICD-10-CM | POA: Diagnosis not present

## 2017-02-01 DIAGNOSIS — Z794 Long term (current) use of insulin: Secondary | ICD-10-CM | POA: Diagnosis not present

## 2017-02-01 DIAGNOSIS — E039 Hypothyroidism, unspecified: Secondary | ICD-10-CM | POA: Diagnosis not present

## 2017-02-01 DIAGNOSIS — I8500 Esophageal varices without bleeding: Secondary | ICD-10-CM | POA: Diagnosis not present

## 2017-02-01 DIAGNOSIS — I10 Essential (primary) hypertension: Secondary | ICD-10-CM | POA: Diagnosis not present

## 2017-02-01 DIAGNOSIS — K7469 Other cirrhosis of liver: Secondary | ICD-10-CM | POA: Diagnosis not present

## 2017-02-01 DIAGNOSIS — K769 Liver disease, unspecified: Secondary | ICD-10-CM | POA: Diagnosis not present

## 2017-02-01 DIAGNOSIS — R918 Other nonspecific abnormal finding of lung field: Secondary | ICD-10-CM | POA: Diagnosis not present

## 2017-02-03 DIAGNOSIS — I10 Essential (primary) hypertension: Secondary | ICD-10-CM | POA: Diagnosis not present

## 2017-02-03 DIAGNOSIS — E119 Type 2 diabetes mellitus without complications: Secondary | ICD-10-CM | POA: Diagnosis not present

## 2017-02-03 DIAGNOSIS — K7469 Other cirrhosis of liver: Secondary | ICD-10-CM | POA: Diagnosis not present

## 2017-02-03 DIAGNOSIS — D696 Thrombocytopenia, unspecified: Secondary | ICD-10-CM | POA: Diagnosis not present

## 2017-02-03 DIAGNOSIS — K769 Liver disease, unspecified: Secondary | ICD-10-CM | POA: Diagnosis not present

## 2017-02-03 DIAGNOSIS — I8500 Esophageal varices without bleeding: Secondary | ICD-10-CM | POA: Diagnosis not present

## 2017-02-03 DIAGNOSIS — Z794 Long term (current) use of insulin: Secondary | ICD-10-CM | POA: Diagnosis not present

## 2017-02-03 DIAGNOSIS — K7291 Hepatic failure, unspecified with coma: Secondary | ICD-10-CM | POA: Diagnosis not present

## 2017-02-03 DIAGNOSIS — R918 Other nonspecific abnormal finding of lung field: Secondary | ICD-10-CM | POA: Diagnosis not present

## 2017-02-03 DIAGNOSIS — E039 Hypothyroidism, unspecified: Secondary | ICD-10-CM | POA: Diagnosis not present

## 2017-02-09 DIAGNOSIS — I10 Essential (primary) hypertension: Secondary | ICD-10-CM | POA: Diagnosis not present

## 2017-02-09 DIAGNOSIS — E039 Hypothyroidism, unspecified: Secondary | ICD-10-CM | POA: Diagnosis not present

## 2017-02-09 DIAGNOSIS — I8500 Esophageal varices without bleeding: Secondary | ICD-10-CM | POA: Diagnosis not present

## 2017-02-09 DIAGNOSIS — D696 Thrombocytopenia, unspecified: Secondary | ICD-10-CM | POA: Diagnosis not present

## 2017-02-09 DIAGNOSIS — K769 Liver disease, unspecified: Secondary | ICD-10-CM | POA: Diagnosis not present

## 2017-02-09 DIAGNOSIS — K7469 Other cirrhosis of liver: Secondary | ICD-10-CM | POA: Diagnosis not present

## 2017-02-09 DIAGNOSIS — Z794 Long term (current) use of insulin: Secondary | ICD-10-CM | POA: Diagnosis not present

## 2017-02-09 DIAGNOSIS — R918 Other nonspecific abnormal finding of lung field: Secondary | ICD-10-CM | POA: Diagnosis not present

## 2017-02-09 DIAGNOSIS — K7291 Hepatic failure, unspecified with coma: Secondary | ICD-10-CM | POA: Diagnosis not present

## 2017-02-09 DIAGNOSIS — E119 Type 2 diabetes mellitus without complications: Secondary | ICD-10-CM | POA: Diagnosis not present

## 2017-02-11 DIAGNOSIS — K7469 Other cirrhosis of liver: Secondary | ICD-10-CM | POA: Diagnosis not present

## 2017-02-11 DIAGNOSIS — D696 Thrombocytopenia, unspecified: Secondary | ICD-10-CM | POA: Diagnosis not present

## 2017-02-11 DIAGNOSIS — I8500 Esophageal varices without bleeding: Secondary | ICD-10-CM | POA: Diagnosis not present

## 2017-02-11 DIAGNOSIS — E119 Type 2 diabetes mellitus without complications: Secondary | ICD-10-CM | POA: Diagnosis not present

## 2017-02-11 DIAGNOSIS — I10 Essential (primary) hypertension: Secondary | ICD-10-CM | POA: Diagnosis not present

## 2017-02-11 DIAGNOSIS — K769 Liver disease, unspecified: Secondary | ICD-10-CM | POA: Diagnosis not present

## 2017-02-11 DIAGNOSIS — K7291 Hepatic failure, unspecified with coma: Secondary | ICD-10-CM | POA: Diagnosis not present

## 2017-02-11 DIAGNOSIS — Z794 Long term (current) use of insulin: Secondary | ICD-10-CM | POA: Diagnosis not present

## 2017-02-11 DIAGNOSIS — E039 Hypothyroidism, unspecified: Secondary | ICD-10-CM | POA: Diagnosis not present

## 2017-02-11 DIAGNOSIS — R918 Other nonspecific abnormal finding of lung field: Secondary | ICD-10-CM | POA: Diagnosis not present

## 2017-03-03 DIAGNOSIS — E039 Hypothyroidism, unspecified: Secondary | ICD-10-CM | POA: Diagnosis not present

## 2017-03-03 DIAGNOSIS — R918 Other nonspecific abnormal finding of lung field: Secondary | ICD-10-CM | POA: Diagnosis not present

## 2017-03-03 DIAGNOSIS — I10 Essential (primary) hypertension: Secondary | ICD-10-CM | POA: Diagnosis not present

## 2017-03-03 DIAGNOSIS — K7469 Other cirrhosis of liver: Secondary | ICD-10-CM | POA: Diagnosis not present

## 2017-03-03 DIAGNOSIS — E119 Type 2 diabetes mellitus without complications: Secondary | ICD-10-CM | POA: Diagnosis not present

## 2017-03-03 DIAGNOSIS — I8500 Esophageal varices without bleeding: Secondary | ICD-10-CM | POA: Diagnosis not present

## 2017-03-03 DIAGNOSIS — D696 Thrombocytopenia, unspecified: Secondary | ICD-10-CM | POA: Diagnosis not present

## 2017-03-03 DIAGNOSIS — Z794 Long term (current) use of insulin: Secondary | ICD-10-CM | POA: Diagnosis not present

## 2017-03-03 DIAGNOSIS — K769 Liver disease, unspecified: Secondary | ICD-10-CM | POA: Diagnosis not present

## 2017-03-03 DIAGNOSIS — K7291 Hepatic failure, unspecified with coma: Secondary | ICD-10-CM | POA: Diagnosis not present

## 2017-03-05 DIAGNOSIS — E7800 Pure hypercholesterolemia, unspecified: Secondary | ICD-10-CM | POA: Diagnosis not present

## 2017-03-05 DIAGNOSIS — E1165 Type 2 diabetes mellitus with hyperglycemia: Secondary | ICD-10-CM | POA: Diagnosis not present

## 2017-03-05 DIAGNOSIS — E039 Hypothyroidism, unspecified: Secondary | ICD-10-CM | POA: Diagnosis not present

## 2017-03-05 DIAGNOSIS — I10 Essential (primary) hypertension: Secondary | ICD-10-CM | POA: Diagnosis not present

## 2017-03-05 DIAGNOSIS — Z794 Long term (current) use of insulin: Secondary | ICD-10-CM | POA: Diagnosis not present

## 2017-03-12 DIAGNOSIS — F329 Major depressive disorder, single episode, unspecified: Secondary | ICD-10-CM | POA: Diagnosis not present

## 2017-03-12 DIAGNOSIS — I8510 Secondary esophageal varices without bleeding: Secondary | ICD-10-CM | POA: Diagnosis not present

## 2017-03-12 DIAGNOSIS — I50812 Chronic right heart failure: Secondary | ICD-10-CM | POA: Diagnosis not present

## 2017-03-12 DIAGNOSIS — K7581 Nonalcoholic steatohepatitis (NASH): Secondary | ICD-10-CM | POA: Diagnosis not present

## 2017-03-12 DIAGNOSIS — E669 Obesity, unspecified: Secondary | ICD-10-CM | POA: Diagnosis not present

## 2017-03-12 DIAGNOSIS — Z0001 Encounter for general adult medical examination with abnormal findings: Secondary | ICD-10-CM | POA: Diagnosis not present

## 2017-03-12 DIAGNOSIS — E1142 Type 2 diabetes mellitus with diabetic polyneuropathy: Secondary | ICD-10-CM | POA: Diagnosis not present

## 2017-03-12 DIAGNOSIS — I10 Essential (primary) hypertension: Secondary | ICD-10-CM | POA: Diagnosis not present

## 2017-03-12 DIAGNOSIS — E559 Vitamin D deficiency, unspecified: Secondary | ICD-10-CM | POA: Diagnosis not present

## 2017-03-12 DIAGNOSIS — K766 Portal hypertension: Secondary | ICD-10-CM | POA: Diagnosis not present

## 2017-03-12 DIAGNOSIS — E039 Hypothyroidism, unspecified: Secondary | ICD-10-CM | POA: Diagnosis not present

## 2017-03-12 DIAGNOSIS — E7800 Pure hypercholesterolemia, unspecified: Secondary | ICD-10-CM | POA: Diagnosis not present

## 2017-03-12 DIAGNOSIS — E1165 Type 2 diabetes mellitus with hyperglycemia: Secondary | ICD-10-CM | POA: Diagnosis not present

## 2017-03-17 DIAGNOSIS — K922 Gastrointestinal hemorrhage, unspecified: Secondary | ICD-10-CM | POA: Diagnosis not present

## 2017-03-17 DIAGNOSIS — K7200 Acute and subacute hepatic failure without coma: Secondary | ICD-10-CM | POA: Diagnosis not present

## 2017-03-17 DIAGNOSIS — E876 Hypokalemia: Secondary | ICD-10-CM | POA: Diagnosis not present

## 2017-03-17 DIAGNOSIS — E1165 Type 2 diabetes mellitus with hyperglycemia: Secondary | ICD-10-CM | POA: Diagnosis not present

## 2017-03-17 DIAGNOSIS — K31819 Angiodysplasia of stomach and duodenum without bleeding: Secondary | ICD-10-CM | POA: Diagnosis not present

## 2017-03-17 DIAGNOSIS — R109 Unspecified abdominal pain: Secondary | ICD-10-CM | POA: Diagnosis not present

## 2017-03-17 DIAGNOSIS — E1142 Type 2 diabetes mellitus with diabetic polyneuropathy: Secondary | ICD-10-CM | POA: Diagnosis not present

## 2017-03-17 DIAGNOSIS — R011 Cardiac murmur, unspecified: Secondary | ICD-10-CM | POA: Diagnosis not present

## 2017-03-17 DIAGNOSIS — I8510 Secondary esophageal varices without bleeding: Secondary | ICD-10-CM | POA: Diagnosis not present

## 2017-03-17 DIAGNOSIS — D696 Thrombocytopenia, unspecified: Secondary | ICD-10-CM | POA: Diagnosis not present

## 2017-03-17 DIAGNOSIS — G2581 Restless legs syndrome: Secondary | ICD-10-CM | POA: Diagnosis not present

## 2017-03-17 DIAGNOSIS — I5020 Unspecified systolic (congestive) heart failure: Secondary | ICD-10-CM | POA: Diagnosis not present

## 2017-03-17 DIAGNOSIS — Z87891 Personal history of nicotine dependence: Secondary | ICD-10-CM | POA: Diagnosis not present

## 2017-03-17 DIAGNOSIS — Z6830 Body mass index (BMI) 30.0-30.9, adult: Secondary | ICD-10-CM | POA: Diagnosis not present

## 2017-03-17 DIAGNOSIS — I350 Nonrheumatic aortic (valve) stenosis: Secondary | ICD-10-CM | POA: Diagnosis not present

## 2017-03-17 DIAGNOSIS — I517 Cardiomegaly: Secondary | ICD-10-CM | POA: Diagnosis not present

## 2017-03-17 DIAGNOSIS — K31811 Angiodysplasia of stomach and duodenum with bleeding: Secondary | ICD-10-CM | POA: Diagnosis not present

## 2017-03-17 DIAGNOSIS — I2721 Secondary pulmonary arterial hypertension: Secondary | ICD-10-CM | POA: Diagnosis not present

## 2017-03-17 DIAGNOSIS — I361 Nonrheumatic tricuspid (valve) insufficiency: Secondary | ICD-10-CM | POA: Diagnosis not present

## 2017-03-17 DIAGNOSIS — I5030 Unspecified diastolic (congestive) heart failure: Secondary | ICD-10-CM | POA: Diagnosis not present

## 2017-03-17 DIAGNOSIS — R5382 Chronic fatigue, unspecified: Secondary | ICD-10-CM | POA: Diagnosis not present

## 2017-03-17 DIAGNOSIS — I340 Nonrheumatic mitral (valve) insufficiency: Secondary | ICD-10-CM | POA: Diagnosis not present

## 2017-03-17 DIAGNOSIS — K766 Portal hypertension: Secondary | ICD-10-CM | POA: Diagnosis not present

## 2017-03-17 DIAGNOSIS — R55 Syncope and collapse: Secondary | ICD-10-CM | POA: Diagnosis not present

## 2017-03-17 DIAGNOSIS — K7581 Nonalcoholic steatohepatitis (NASH): Secondary | ICD-10-CM | POA: Diagnosis not present

## 2017-03-17 DIAGNOSIS — R4182 Altered mental status, unspecified: Secondary | ICD-10-CM | POA: Diagnosis not present

## 2017-03-17 DIAGNOSIS — D62 Acute posthemorrhagic anemia: Secondary | ICD-10-CM | POA: Diagnosis not present

## 2017-03-17 DIAGNOSIS — R404 Transient alteration of awareness: Secondary | ICD-10-CM | POA: Diagnosis not present

## 2017-03-17 DIAGNOSIS — D6959 Other secondary thrombocytopenia: Secondary | ICD-10-CM | POA: Diagnosis not present

## 2017-03-17 DIAGNOSIS — I110 Hypertensive heart disease with heart failure: Secondary | ICD-10-CM | POA: Diagnosis not present

## 2017-03-17 DIAGNOSIS — K449 Diaphragmatic hernia without obstruction or gangrene: Secondary | ICD-10-CM | POA: Diagnosis not present

## 2017-03-17 DIAGNOSIS — N390 Urinary tract infection, site not specified: Secondary | ICD-10-CM | POA: Diagnosis not present

## 2017-03-17 DIAGNOSIS — I8511 Secondary esophageal varices with bleeding: Secondary | ICD-10-CM | POA: Diagnosis not present

## 2017-03-17 DIAGNOSIS — E039 Hypothyroidism, unspecified: Secondary | ICD-10-CM | POA: Diagnosis not present

## 2017-03-17 DIAGNOSIS — K7460 Unspecified cirrhosis of liver: Secondary | ICD-10-CM | POA: Diagnosis not present

## 2017-03-17 DIAGNOSIS — I10 Essential (primary) hypertension: Secondary | ICD-10-CM | POA: Diagnosis not present

## 2017-03-17 DIAGNOSIS — K7290 Hepatic failure, unspecified without coma: Secondary | ICD-10-CM | POA: Diagnosis not present

## 2017-03-17 DIAGNOSIS — I083 Combined rheumatic disorders of mitral, aortic and tricuspid valves: Secondary | ICD-10-CM | POA: Diagnosis not present

## 2017-03-17 DIAGNOSIS — K921 Melena: Secondary | ICD-10-CM | POA: Diagnosis not present

## 2017-03-17 DIAGNOSIS — K7469 Other cirrhosis of liver: Secondary | ICD-10-CM | POA: Diagnosis not present

## 2017-03-17 DIAGNOSIS — K3189 Other diseases of stomach and duodenum: Secondary | ICD-10-CM | POA: Diagnosis not present

## 2017-03-17 DIAGNOSIS — I50810 Right heart failure, unspecified: Secondary | ICD-10-CM | POA: Diagnosis not present

## 2017-03-24 DIAGNOSIS — K7581 Nonalcoholic steatohepatitis (NASH): Secondary | ICD-10-CM | POA: Diagnosis not present

## 2017-03-24 DIAGNOSIS — K7469 Other cirrhosis of liver: Secondary | ICD-10-CM | POA: Diagnosis not present

## 2017-03-24 DIAGNOSIS — I8500 Esophageal varices without bleeding: Secondary | ICD-10-CM | POA: Diagnosis not present

## 2017-03-24 DIAGNOSIS — K7290 Hepatic failure, unspecified without coma: Secondary | ICD-10-CM | POA: Diagnosis not present

## 2017-03-24 DIAGNOSIS — I110 Hypertensive heart disease with heart failure: Secondary | ICD-10-CM | POA: Diagnosis not present

## 2017-03-24 DIAGNOSIS — N390 Urinary tract infection, site not specified: Secondary | ICD-10-CM | POA: Diagnosis not present

## 2017-03-24 DIAGNOSIS — D696 Thrombocytopenia, unspecified: Secondary | ICD-10-CM | POA: Diagnosis not present

## 2017-03-24 DIAGNOSIS — Z8719 Personal history of other diseases of the digestive system: Secondary | ICD-10-CM | POA: Diagnosis not present

## 2017-03-24 DIAGNOSIS — E1165 Type 2 diabetes mellitus with hyperglycemia: Secondary | ICD-10-CM | POA: Diagnosis not present

## 2017-03-24 DIAGNOSIS — Z87891 Personal history of nicotine dependence: Secondary | ICD-10-CM | POA: Diagnosis not present

## 2017-03-24 DIAGNOSIS — K31811 Angiodysplasia of stomach and duodenum with bleeding: Secondary | ICD-10-CM | POA: Diagnosis not present

## 2017-03-24 DIAGNOSIS — Z794 Long term (current) use of insulin: Secondary | ICD-10-CM | POA: Diagnosis not present

## 2017-03-24 DIAGNOSIS — E039 Hypothyroidism, unspecified: Secondary | ICD-10-CM | POA: Diagnosis not present

## 2017-03-24 DIAGNOSIS — D509 Iron deficiency anemia, unspecified: Secondary | ICD-10-CM | POA: Diagnosis not present

## 2017-03-24 DIAGNOSIS — I509 Heart failure, unspecified: Secondary | ICD-10-CM | POA: Diagnosis not present

## 2017-03-26 DIAGNOSIS — K7200 Acute and subacute hepatic failure without coma: Secondary | ICD-10-CM | POA: Diagnosis not present

## 2017-03-26 DIAGNOSIS — K7581 Nonalcoholic steatohepatitis (NASH): Secondary | ICD-10-CM | POA: Diagnosis not present

## 2017-03-26 DIAGNOSIS — K7460 Unspecified cirrhosis of liver: Secondary | ICD-10-CM | POA: Diagnosis not present

## 2017-03-26 DIAGNOSIS — K766 Portal hypertension: Secondary | ICD-10-CM | POA: Diagnosis not present

## 2017-03-27 ENCOUNTER — Other Ambulatory Visit: Payer: Self-pay | Admitting: *Deleted

## 2017-03-27 NOTE — Patient Outreach (Signed)
[facility]) Care Management  03/27/2017  Kimberly [name] Aug 19, 1940 [id number]  Transition of Care Referral  Referral Date: 03/27/17 Referral Source: Insurance Referral (Urgent TOC) Date of Discharge: 03/22/17 Facility: [facility] Discharge Diagnosis: AMS Insurance: HTA  Outreach attempt #1 to patient. No answer and unable to leave voicemail message.  Plan: RN CM will contact patient within the next business day.   Wynelle Cleveland, RN, BSN, MHA/MSL, [facility] Telephonic Care Manager Coordinator [facility] Direct Phone: [phone] Toll Free: [phone] Fax: [phone]

## 2017-03-30 ENCOUNTER — Ambulatory Visit: Payer: Self-pay | Admitting: *Deleted

## 2017-03-30 ENCOUNTER — Other Ambulatory Visit: Payer: Self-pay | Admitting: *Deleted

## 2017-03-30 NOTE — Patient Outreach (Signed)
  [facility]) Care Management  03/30/2017  Kimberly [name] [birth year] [id number]   Transition of Care Referral  Referral Date: 03/27/17 Referral Source: Insurance Referral (Urgent TOC) Date of Discharge: 03/22/17 Facility: [facility] Discharge Diagnosis: AMS Insurance: HTA  [facility] has been completed by PCP's office, who will refer to [facility] CM, if needed.  Wynelle Cleveland, RN, BSN, MHA/MSL, [facility] Telephonic Care Manager Coordinator [facility] Direct Phone: [phone] Toll Free: [phone] Fax: [phone]

## 2017-03-31 DIAGNOSIS — E039 Hypothyroidism, unspecified: Secondary | ICD-10-CM | POA: Diagnosis not present

## 2017-03-31 DIAGNOSIS — D509 Iron deficiency anemia, unspecified: Secondary | ICD-10-CM | POA: Diagnosis not present

## 2017-03-31 DIAGNOSIS — Z87891 Personal history of nicotine dependence: Secondary | ICD-10-CM | POA: Diagnosis not present

## 2017-03-31 DIAGNOSIS — K7469 Other cirrhosis of liver: Secondary | ICD-10-CM | POA: Diagnosis not present

## 2017-03-31 DIAGNOSIS — Z794 Long term (current) use of insulin: Secondary | ICD-10-CM | POA: Diagnosis not present

## 2017-03-31 DIAGNOSIS — D696 Thrombocytopenia, unspecified: Secondary | ICD-10-CM | POA: Diagnosis not present

## 2017-03-31 DIAGNOSIS — E1165 Type 2 diabetes mellitus with hyperglycemia: Secondary | ICD-10-CM | POA: Diagnosis not present

## 2017-03-31 DIAGNOSIS — K7581 Nonalcoholic steatohepatitis (NASH): Secondary | ICD-10-CM | POA: Diagnosis not present

## 2017-03-31 DIAGNOSIS — K7290 Hepatic failure, unspecified without coma: Secondary | ICD-10-CM | POA: Diagnosis not present

## 2017-03-31 DIAGNOSIS — I110 Hypertensive heart disease with heart failure: Secondary | ICD-10-CM | POA: Diagnosis not present

## 2017-03-31 DIAGNOSIS — K31811 Angiodysplasia of stomach and duodenum with bleeding: Secondary | ICD-10-CM | POA: Diagnosis not present

## 2017-03-31 DIAGNOSIS — Z8719 Personal history of other diseases of the digestive system: Secondary | ICD-10-CM | POA: Diagnosis not present

## 2017-03-31 DIAGNOSIS — I509 Heart failure, unspecified: Secondary | ICD-10-CM | POA: Diagnosis not present

## 2017-03-31 DIAGNOSIS — N390 Urinary tract infection, site not specified: Secondary | ICD-10-CM | POA: Diagnosis not present

## 2017-03-31 DIAGNOSIS — I8500 Esophageal varices without bleeding: Secondary | ICD-10-CM | POA: Diagnosis not present

## 2017-04-01 DIAGNOSIS — E1142 Type 2 diabetes mellitus with diabetic polyneuropathy: Secondary | ICD-10-CM | POA: Diagnosis not present

## 2017-04-01 DIAGNOSIS — I10 Essential (primary) hypertension: Secondary | ICD-10-CM | POA: Diagnosis not present

## 2017-04-01 DIAGNOSIS — E039 Hypothyroidism, unspecified: Secondary | ICD-10-CM | POA: Diagnosis not present

## 2017-04-01 DIAGNOSIS — F09 Unspecified mental disorder due to known physiological condition: Secondary | ICD-10-CM | POA: Diagnosis not present

## 2017-04-01 DIAGNOSIS — E559 Vitamin D deficiency, unspecified: Secondary | ICD-10-CM | POA: Diagnosis not present

## 2017-04-01 DIAGNOSIS — Z794 Long term (current) use of insulin: Secondary | ICD-10-CM | POA: Diagnosis not present

## 2017-04-01 DIAGNOSIS — E1165 Type 2 diabetes mellitus with hyperglycemia: Secondary | ICD-10-CM | POA: Diagnosis not present

## 2017-04-01 DIAGNOSIS — I8510 Secondary esophageal varices without bleeding: Secondary | ICD-10-CM | POA: Diagnosis not present

## 2017-04-01 DIAGNOSIS — K7460 Unspecified cirrhosis of liver: Secondary | ICD-10-CM | POA: Diagnosis not present

## 2017-04-01 DIAGNOSIS — I272 Pulmonary hypertension, unspecified: Secondary | ICD-10-CM | POA: Diagnosis not present

## 2017-04-01 DIAGNOSIS — R269 Unspecified abnormalities of gait and mobility: Secondary | ICD-10-CM | POA: Diagnosis not present

## 2017-04-01 DIAGNOSIS — K7581 Nonalcoholic steatohepatitis (NASH): Secondary | ICD-10-CM | POA: Diagnosis not present

## 2017-04-02 DIAGNOSIS — Z794 Long term (current) use of insulin: Secondary | ICD-10-CM | POA: Diagnosis not present

## 2017-04-02 DIAGNOSIS — K7581 Nonalcoholic steatohepatitis (NASH): Secondary | ICD-10-CM | POA: Diagnosis not present

## 2017-04-02 DIAGNOSIS — K7469 Other cirrhosis of liver: Secondary | ICD-10-CM | POA: Diagnosis not present

## 2017-04-02 DIAGNOSIS — D509 Iron deficiency anemia, unspecified: Secondary | ICD-10-CM | POA: Diagnosis not present

## 2017-04-02 DIAGNOSIS — I8500 Esophageal varices without bleeding: Secondary | ICD-10-CM | POA: Diagnosis not present

## 2017-04-02 DIAGNOSIS — K31811 Angiodysplasia of stomach and duodenum with bleeding: Secondary | ICD-10-CM | POA: Diagnosis not present

## 2017-04-02 DIAGNOSIS — I509 Heart failure, unspecified: Secondary | ICD-10-CM | POA: Diagnosis not present

## 2017-04-02 DIAGNOSIS — Z87891 Personal history of nicotine dependence: Secondary | ICD-10-CM | POA: Diagnosis not present

## 2017-04-02 DIAGNOSIS — I110 Hypertensive heart disease with heart failure: Secondary | ICD-10-CM | POA: Diagnosis not present

## 2017-04-02 DIAGNOSIS — Z8719 Personal history of other diseases of the digestive system: Secondary | ICD-10-CM | POA: Diagnosis not present

## 2017-04-02 DIAGNOSIS — E039 Hypothyroidism, unspecified: Secondary | ICD-10-CM | POA: Diagnosis not present

## 2017-04-02 DIAGNOSIS — E1165 Type 2 diabetes mellitus with hyperglycemia: Secondary | ICD-10-CM | POA: Diagnosis not present

## 2017-04-02 DIAGNOSIS — D696 Thrombocytopenia, unspecified: Secondary | ICD-10-CM | POA: Diagnosis not present

## 2017-04-02 DIAGNOSIS — K7290 Hepatic failure, unspecified without coma: Secondary | ICD-10-CM | POA: Diagnosis not present

## 2017-04-02 DIAGNOSIS — N390 Urinary tract infection, site not specified: Secondary | ICD-10-CM | POA: Diagnosis not present

## 2017-04-07 DIAGNOSIS — E039 Hypothyroidism, unspecified: Secondary | ICD-10-CM | POA: Diagnosis not present

## 2017-04-07 DIAGNOSIS — N390 Urinary tract infection, site not specified: Secondary | ICD-10-CM | POA: Diagnosis not present

## 2017-04-07 DIAGNOSIS — E1165 Type 2 diabetes mellitus with hyperglycemia: Secondary | ICD-10-CM | POA: Diagnosis not present

## 2017-04-07 DIAGNOSIS — Z87891 Personal history of nicotine dependence: Secondary | ICD-10-CM | POA: Diagnosis not present

## 2017-04-07 DIAGNOSIS — I110 Hypertensive heart disease with heart failure: Secondary | ICD-10-CM | POA: Diagnosis not present

## 2017-04-07 DIAGNOSIS — Z8719 Personal history of other diseases of the digestive system: Secondary | ICD-10-CM | POA: Diagnosis not present

## 2017-04-07 DIAGNOSIS — K7290 Hepatic failure, unspecified without coma: Secondary | ICD-10-CM | POA: Diagnosis not present

## 2017-04-07 DIAGNOSIS — I509 Heart failure, unspecified: Secondary | ICD-10-CM | POA: Diagnosis not present

## 2017-04-07 DIAGNOSIS — D509 Iron deficiency anemia, unspecified: Secondary | ICD-10-CM | POA: Diagnosis not present

## 2017-04-07 DIAGNOSIS — K31811 Angiodysplasia of stomach and duodenum with bleeding: Secondary | ICD-10-CM | POA: Diagnosis not present

## 2017-04-07 DIAGNOSIS — K7581 Nonalcoholic steatohepatitis (NASH): Secondary | ICD-10-CM | POA: Diagnosis not present

## 2017-04-07 DIAGNOSIS — D696 Thrombocytopenia, unspecified: Secondary | ICD-10-CM | POA: Diagnosis not present

## 2017-04-07 DIAGNOSIS — I8500 Esophageal varices without bleeding: Secondary | ICD-10-CM | POA: Diagnosis not present

## 2017-04-07 DIAGNOSIS — Z794 Long term (current) use of insulin: Secondary | ICD-10-CM | POA: Diagnosis not present

## 2017-04-07 DIAGNOSIS — K7469 Other cirrhosis of liver: Secondary | ICD-10-CM | POA: Diagnosis not present

## 2017-05-09 IMAGING — CT CT ANGIO CHEST
2 of 9 series · 16 of 46 positions shown · IV contrast (APPLIED)
Comparison: None.

CLINICAL DATA: Low abdominal pain radiating into chest.

EXAM:
CT ANGIOGRAPHY CHEST, ABDOMEN AND PELVIS
TECHNIQUE: Multidetector CT imaging through the chest, abdomen and pelvis was
performed using the standard protocol during bolus administration of
intravenous contrast. Multiplanar reconstructed images and MIPs were
obtained and reviewed to evaluate the vascular anatomy.
CONTRAST:  100 cc Isovue 370 intravenous

[Series 5: axial arterial · axial · arterial · 0.98mm/px · z∈[-483,+111]mm · 13 of 222 slices shown]
[im 12/222  lung]
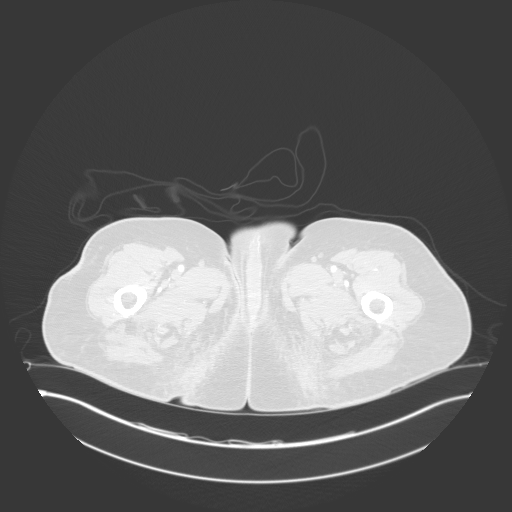
[im 34/222  soft-tissue]
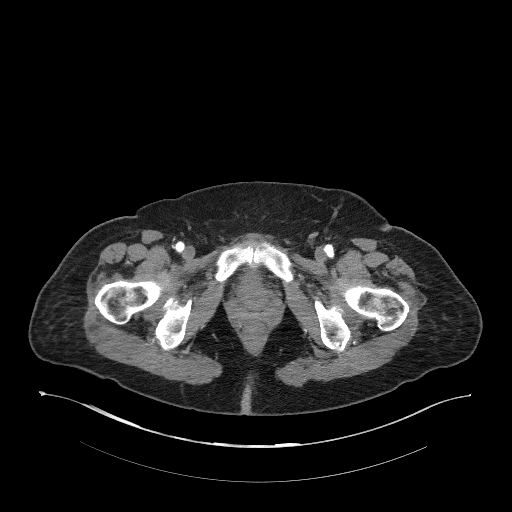
[im 45/222  lung]
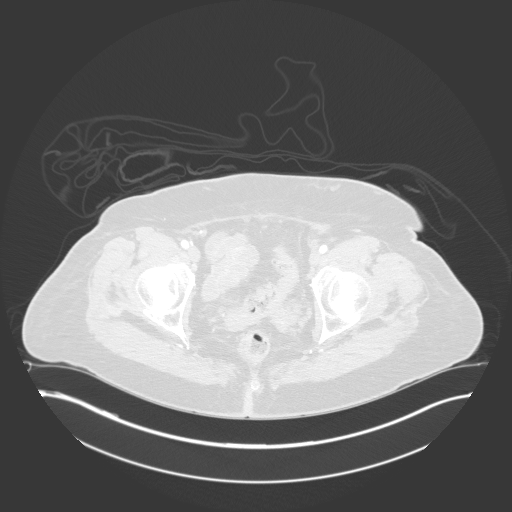
[im 67/222  soft-tissue]
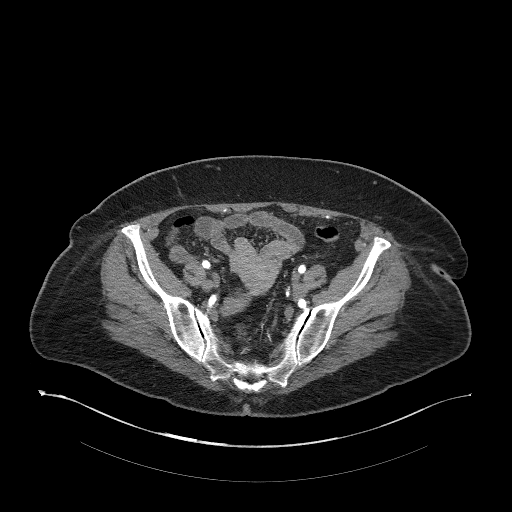
[im 78/222  lung]
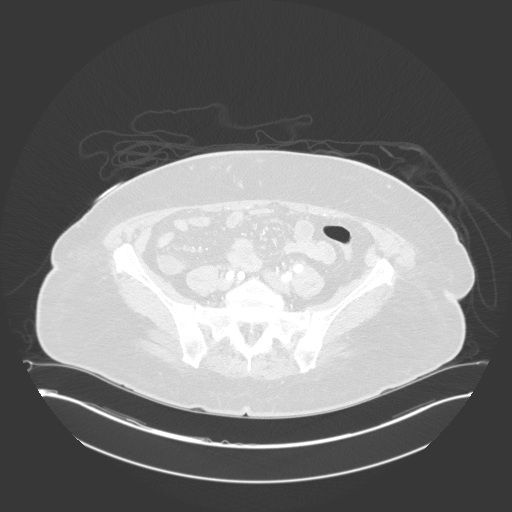
[im 100/222  soft-tissue]
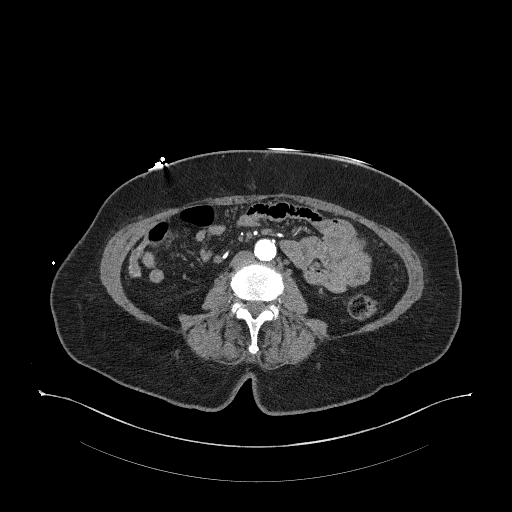
[im 111/222  lung]
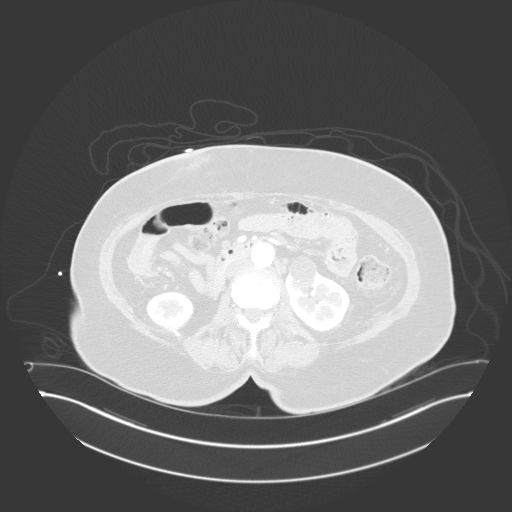
[im 122/222  soft-tissue]
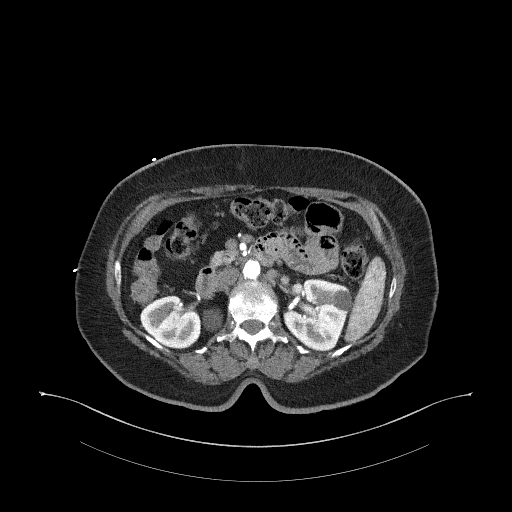
[im 144/222  lung]
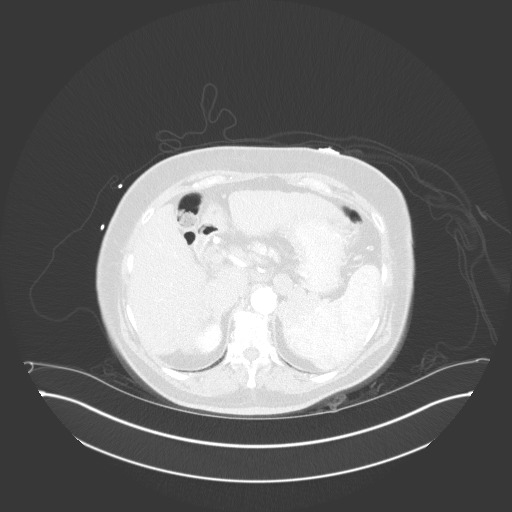
[im 155/222  soft-tissue]
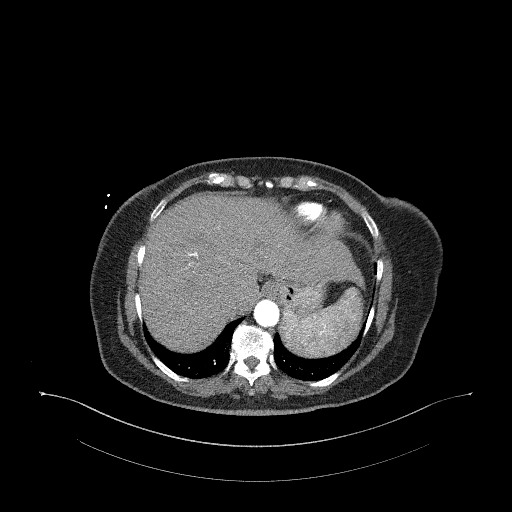
[im 177/222  lung]
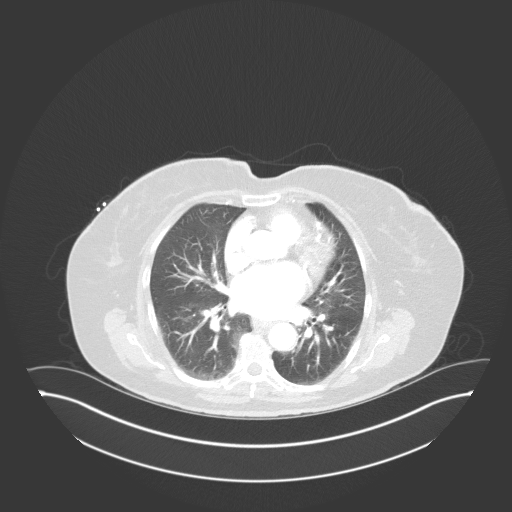
[im 188/222  soft-tissue]
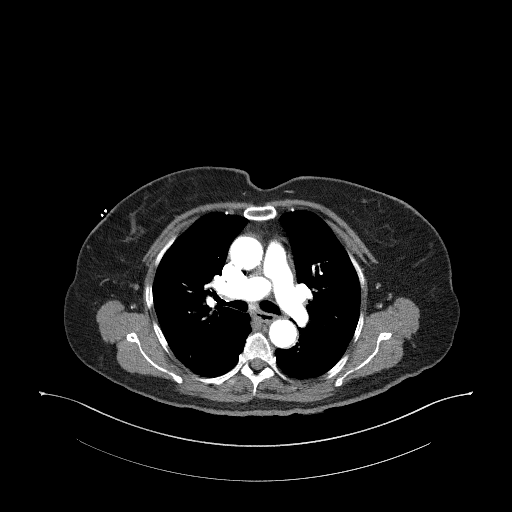
[im 210/222  lung]
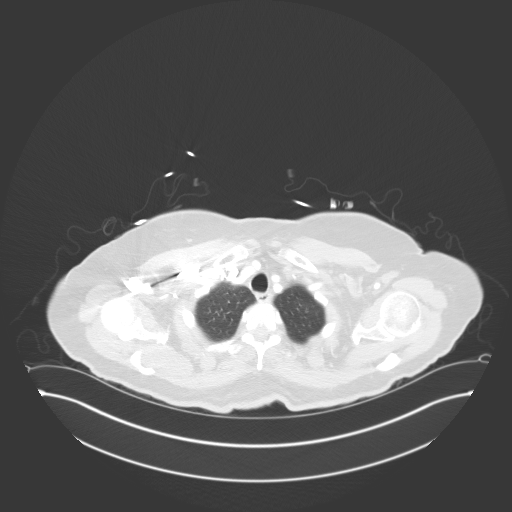

[Series 8: coronals · coronal · 0.92mm/px · 3 of 128 slices shown]
[im 32/128  soft-tissue]
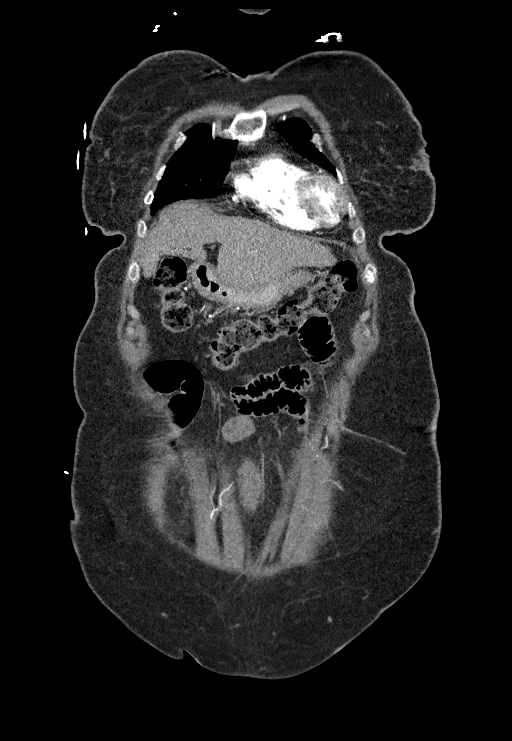
[im 64/128  soft-tissue]
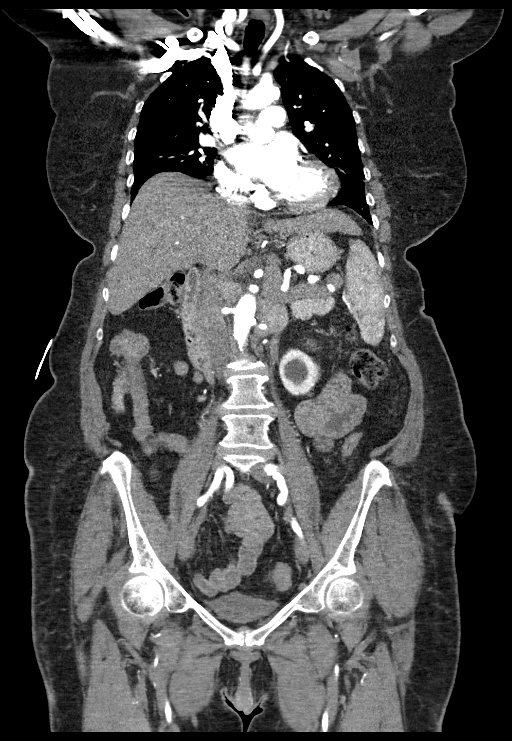
[im 96/128  soft-tissue]
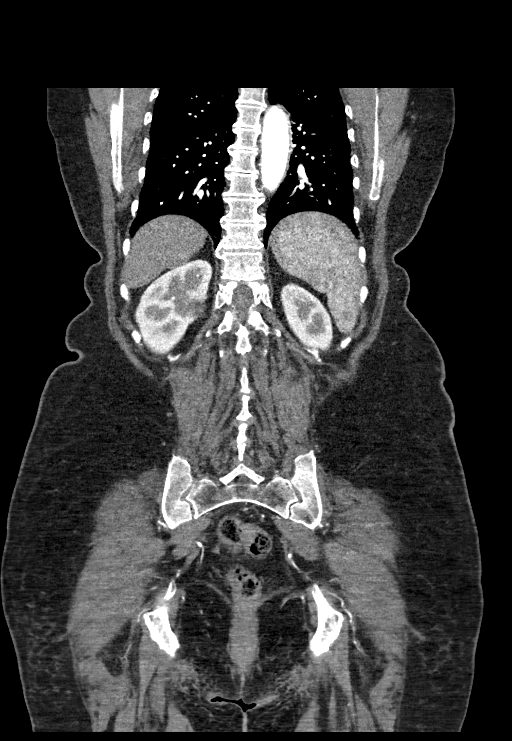

[16 of 46 positions shown; findings below may reference images not displayed]

FINDINGS: CTA CHEST FINDINGS

Cardiovascular: Exam optimized to evaluate the aorta. Precontrast
imaging shows no intramural hematoma. Negative for dissection,
aneurysm, or penetrating ulcer. Three vessel arch with patent great
vessels. Diffuse atherosclerotic calcification of the aorta and
coronaries. Mild cardiomegaly without pericardial effusion. Mild
calcification of the aortic valve. No evidence of pulmonary
embolism.

Mediastinum: No mass or adenopathy

Lungs/Pleura: There is no edema, consolidation, effusion, or
pneumothorax.

Musculoskeletal: No acute or aggressive finding.  Spondylosis.

Review of the MIP images confirms the above findings.

CTA ABDOMEN AND PELVIS FINDINGS

VASCULAR

Aorta: No dissection, vasculitis or significant stenosis. Bilobed
ectasia of the infrarenal aorta measuring up to 25 mm in maximal
diameter.

Celiac: Patent. No flow limiting stenosis. Hypertrophied hepatic
artery in the setting this cirrhosis.

SMA: Atherosclerotic plaque at the ostium without stenosis.

Renals: Accessory left lower pole renal artery. Atheromatous changes
at the ostia without evidence of flow limiting stenosis. No beading
or dissection.

IMA: Patent

Inflow: Atherosclerotic calcification.  No stenosis or dissection.

Veins: Systemic veins are non-opacified. No distension or unexpected
density.

Portal hypertension with splenorenal varices

Review of the MIP images confirms the above findings.

NON-VASCULAR

Hepatobiliary: Cirrhotic liver morphology with lobulated liver
surface and caudate lobe enlargement.Cholecystectomy. Negative
common bile duct.

Pancreas: Unremarkable.

Spleen: Mild enlargement in the setting of portal hypertension

Adrenals/Urinary Tract: Negative adrenals. No hydronephrosis or
stone. Bilateral simple renal cysts. Unremarkable bladder.

Stomach/Bowel: No obstruction. No inflammatory changes. Mild distal
colonic diverticulosis.

Lymphatic:   No mass or adenopathy.

Reproductive:Hysterectomy.  Negative adnexa.

Other: No ascites or pneumoperitoneum.

Musculoskeletal: No acute abnormalities.

Review of the MIP images confirms the above findings.
IMPRESSION: 1. No acute finding.  No evidence of acute aortic syndrome.
2. Cirrhosis with splenorenal varices.
3. Extensive atherosclerosis.
4. Ectatic (25 mm) abdominal aorta at risk for aneurysm development.
Recommend followup by ultrasound in 5 years. This recommendation
follows ACR consensus guidelines: White Paper of the ACR Incidental

## 2017-05-15 DIAGNOSIS — I350 Nonrheumatic aortic (valve) stenosis: Secondary | ICD-10-CM | POA: Diagnosis not present

## 2017-05-15 DIAGNOSIS — Z794 Long term (current) use of insulin: Secondary | ICD-10-CM | POA: Diagnosis not present

## 2017-05-15 DIAGNOSIS — I10 Essential (primary) hypertension: Secondary | ICD-10-CM | POA: Diagnosis not present

## 2017-05-15 DIAGNOSIS — R110 Nausea: Secondary | ICD-10-CM | POA: Diagnosis not present

## 2017-05-15 DIAGNOSIS — R9431 Abnormal electrocardiogram [ECG] [EKG]: Secondary | ICD-10-CM | POA: Diagnosis not present

## 2017-05-15 DIAGNOSIS — K7460 Unspecified cirrhosis of liver: Secondary | ICD-10-CM | POA: Diagnosis not present

## 2017-05-15 DIAGNOSIS — K7581 Nonalcoholic steatohepatitis (NASH): Secondary | ICD-10-CM | POA: Diagnosis not present

## 2017-05-15 DIAGNOSIS — E1165 Type 2 diabetes mellitus with hyperglycemia: Secondary | ICD-10-CM | POA: Diagnosis not present

## 2017-05-15 DIAGNOSIS — E039 Hypothyroidism, unspecified: Secondary | ICD-10-CM | POA: Diagnosis not present

## 2017-05-15 DIAGNOSIS — R7989 Other specified abnormal findings of blood chemistry: Secondary | ICD-10-CM | POA: Diagnosis not present

## 2017-05-15 DIAGNOSIS — D696 Thrombocytopenia, unspecified: Secondary | ICD-10-CM | POA: Diagnosis not present

## 2017-05-15 DIAGNOSIS — R404 Transient alteration of awareness: Secondary | ICD-10-CM | POA: Diagnosis not present

## 2017-05-15 DIAGNOSIS — R55 Syncope and collapse: Secondary | ICD-10-CM | POA: Diagnosis not present

## 2017-05-15 DIAGNOSIS — R011 Cardiac murmur, unspecified: Secondary | ICD-10-CM | POA: Diagnosis not present

## 2017-05-15 DIAGNOSIS — I951 Orthostatic hypotension: Secondary | ICD-10-CM | POA: Diagnosis not present

## 2017-05-15 DIAGNOSIS — E1142 Type 2 diabetes mellitus with diabetic polyneuropathy: Secondary | ICD-10-CM | POA: Diagnosis not present

## 2017-05-15 DIAGNOSIS — I361 Nonrheumatic tricuspid (valve) insufficiency: Secondary | ICD-10-CM | POA: Diagnosis not present

## 2017-05-15 DIAGNOSIS — I517 Cardiomegaly: Secondary | ICD-10-CM | POA: Diagnosis not present

## 2017-05-15 DIAGNOSIS — Z87891 Personal history of nicotine dependence: Secondary | ICD-10-CM | POA: Diagnosis not present

## 2017-05-15 DIAGNOSIS — Z90710 Acquired absence of both cervix and uterus: Secondary | ICD-10-CM | POA: Diagnosis not present

## 2017-05-15 DIAGNOSIS — D509 Iron deficiency anemia, unspecified: Secondary | ICD-10-CM | POA: Diagnosis not present

## 2017-05-15 DIAGNOSIS — D61818 Other pancytopenia: Secondary | ICD-10-CM | POA: Diagnosis not present

## 2017-07-08 DIAGNOSIS — I10 Essential (primary) hypertension: Secondary | ICD-10-CM | POA: Diagnosis not present

## 2017-07-08 DIAGNOSIS — E1165 Type 2 diabetes mellitus with hyperglycemia: Secondary | ICD-10-CM | POA: Diagnosis not present

## 2017-07-08 DIAGNOSIS — E7800 Pure hypercholesterolemia, unspecified: Secondary | ICD-10-CM | POA: Diagnosis not present

## 2017-07-08 DIAGNOSIS — E039 Hypothyroidism, unspecified: Secondary | ICD-10-CM | POA: Diagnosis not present

## 2017-09-09 DIAGNOSIS — I10 Essential (primary) hypertension: Secondary | ICD-10-CM | POA: Diagnosis not present

## 2017-09-09 DIAGNOSIS — D500 Iron deficiency anemia secondary to blood loss (chronic): Secondary | ICD-10-CM | POA: Diagnosis not present

## 2017-09-09 DIAGNOSIS — E1165 Type 2 diabetes mellitus with hyperglycemia: Secondary | ICD-10-CM | POA: Diagnosis not present

## 2017-09-09 DIAGNOSIS — Z794 Long term (current) use of insulin: Secondary | ICD-10-CM | POA: Diagnosis not present

## 2017-09-09 DIAGNOSIS — E559 Vitamin D deficiency, unspecified: Secondary | ICD-10-CM | POA: Diagnosis not present

## 2017-09-09 DIAGNOSIS — E039 Hypothyroidism, unspecified: Secondary | ICD-10-CM | POA: Diagnosis not present

## 2017-10-14 DIAGNOSIS — E1165 Type 2 diabetes mellitus with hyperglycemia: Secondary | ICD-10-CM | POA: Diagnosis not present

## 2017-10-14 DIAGNOSIS — I10 Essential (primary) hypertension: Secondary | ICD-10-CM | POA: Diagnosis not present

## 2017-10-14 DIAGNOSIS — Z13820 Encounter for screening for osteoporosis: Secondary | ICD-10-CM | POA: Diagnosis not present

## 2017-10-14 DIAGNOSIS — E7800 Pure hypercholesterolemia, unspecified: Secondary | ICD-10-CM | POA: Diagnosis not present

## 2017-11-25 DIAGNOSIS — E039 Hypothyroidism, unspecified: Secondary | ICD-10-CM | POA: Diagnosis not present

## 2017-11-25 DIAGNOSIS — E1165 Type 2 diabetes mellitus with hyperglycemia: Secondary | ICD-10-CM | POA: Diagnosis not present

## 2017-11-25 DIAGNOSIS — Z794 Long term (current) use of insulin: Secondary | ICD-10-CM | POA: Diagnosis not present

## 2017-11-25 DIAGNOSIS — Z9114 Patient's other noncompliance with medication regimen: Secondary | ICD-10-CM | POA: Diagnosis not present

## 2017-11-25 DIAGNOSIS — I8500 Esophageal varices without bleeding: Secondary | ICD-10-CM | POA: Diagnosis not present

## 2017-11-25 DIAGNOSIS — E876 Hypokalemia: Secondary | ICD-10-CM | POA: Diagnosis not present

## 2017-11-25 DIAGNOSIS — D61818 Other pancytopenia: Secondary | ICD-10-CM | POA: Diagnosis not present

## 2017-11-25 DIAGNOSIS — F329 Major depressive disorder, single episode, unspecified: Secondary | ICD-10-CM | POA: Diagnosis not present

## 2017-11-25 DIAGNOSIS — K766 Portal hypertension: Secondary | ICD-10-CM | POA: Diagnosis not present

## 2017-11-25 DIAGNOSIS — E785 Hyperlipidemia, unspecified: Secondary | ICD-10-CM | POA: Diagnosis not present

## 2017-11-25 DIAGNOSIS — Z8249 Family history of ischemic heart disease and other diseases of the circulatory system: Secondary | ICD-10-CM | POA: Diagnosis not present

## 2017-11-25 DIAGNOSIS — R404 Transient alteration of awareness: Secondary | ICD-10-CM | POA: Diagnosis not present

## 2017-11-25 DIAGNOSIS — I10 Essential (primary) hypertension: Secondary | ICD-10-CM | POA: Diagnosis not present

## 2017-11-25 DIAGNOSIS — F17210 Nicotine dependence, cigarettes, uncomplicated: Secondary | ICD-10-CM | POA: Diagnosis not present

## 2017-11-25 DIAGNOSIS — K5781 Diverticulitis of intestine, part unspecified, with perforation and abscess with bleeding: Secondary | ICD-10-CM | POA: Diagnosis not present

## 2017-11-25 DIAGNOSIS — R531 Weakness: Secondary | ICD-10-CM | POA: Diagnosis not present

## 2017-11-25 DIAGNOSIS — K7581 Nonalcoholic steatohepatitis (NASH): Secondary | ICD-10-CM | POA: Diagnosis not present

## 2017-11-25 DIAGNOSIS — Z806 Family history of leukemia: Secondary | ICD-10-CM | POA: Diagnosis not present

## 2017-11-25 DIAGNOSIS — Z9049 Acquired absence of other specified parts of digestive tract: Secondary | ICD-10-CM | POA: Diagnosis not present

## 2017-11-25 DIAGNOSIS — M539 Dorsopathy, unspecified: Secondary | ICD-10-CM | POA: Diagnosis not present

## 2017-11-25 DIAGNOSIS — Z90710 Acquired absence of both cervix and uterus: Secondary | ICD-10-CM | POA: Diagnosis not present

## 2017-11-25 DIAGNOSIS — I501 Left ventricular failure: Secondary | ICD-10-CM | POA: Diagnosis not present

## 2017-11-25 DIAGNOSIS — K7460 Unspecified cirrhosis of liver: Secondary | ICD-10-CM | POA: Diagnosis not present

## 2017-11-25 DIAGNOSIS — I110 Hypertensive heart disease with heart failure: Secondary | ICD-10-CM | POA: Diagnosis not present

## 2017-11-25 DIAGNOSIS — E7220 Disorder of urea cycle metabolism, unspecified: Secondary | ICD-10-CM | POA: Diagnosis not present

## 2017-11-30 DIAGNOSIS — F329 Major depressive disorder, single episode, unspecified: Secondary | ICD-10-CM | POA: Diagnosis not present

## 2017-11-30 DIAGNOSIS — I8500 Esophageal varices without bleeding: Secondary | ICD-10-CM | POA: Diagnosis not present

## 2017-11-30 DIAGNOSIS — K7581 Nonalcoholic steatohepatitis (NASH): Secondary | ICD-10-CM | POA: Diagnosis not present

## 2017-11-30 DIAGNOSIS — I110 Hypertensive heart disease with heart failure: Secondary | ICD-10-CM | POA: Diagnosis not present

## 2017-11-30 DIAGNOSIS — F17210 Nicotine dependence, cigarettes, uncomplicated: Secondary | ICD-10-CM | POA: Diagnosis not present

## 2017-11-30 DIAGNOSIS — K5730 Diverticulosis of large intestine without perforation or abscess without bleeding: Secondary | ICD-10-CM | POA: Diagnosis not present

## 2017-11-30 DIAGNOSIS — K31819 Angiodysplasia of stomach and duodenum without bleeding: Secondary | ICD-10-CM | POA: Diagnosis not present

## 2017-11-30 DIAGNOSIS — I509 Heart failure, unspecified: Secondary | ICD-10-CM | POA: Diagnosis not present

## 2017-11-30 DIAGNOSIS — E1165 Type 2 diabetes mellitus with hyperglycemia: Secondary | ICD-10-CM | POA: Diagnosis not present

## 2017-11-30 DIAGNOSIS — I350 Nonrheumatic aortic (valve) stenosis: Secondary | ICD-10-CM | POA: Diagnosis not present

## 2017-11-30 DIAGNOSIS — K449 Diaphragmatic hernia without obstruction or gangrene: Secondary | ICD-10-CM | POA: Diagnosis not present

## 2017-11-30 DIAGNOSIS — K7460 Unspecified cirrhosis of liver: Secondary | ICD-10-CM | POA: Diagnosis not present

## 2017-11-30 DIAGNOSIS — I272 Pulmonary hypertension, unspecified: Secondary | ICD-10-CM | POA: Diagnosis not present

## 2017-12-01 DIAGNOSIS — E7220 Disorder of urea cycle metabolism, unspecified: Secondary | ICD-10-CM | POA: Diagnosis not present

## 2017-12-01 DIAGNOSIS — K7581 Nonalcoholic steatohepatitis (NASH): Secondary | ICD-10-CM | POA: Diagnosis not present

## 2017-12-01 DIAGNOSIS — E039 Hypothyroidism, unspecified: Secondary | ICD-10-CM | POA: Diagnosis not present

## 2017-12-01 DIAGNOSIS — R790 Abnormal level of blood mineral: Secondary | ICD-10-CM | POA: Diagnosis not present

## 2017-12-01 DIAGNOSIS — K7460 Unspecified cirrhosis of liver: Secondary | ICD-10-CM | POA: Diagnosis not present

## 2017-12-04 DIAGNOSIS — D509 Iron deficiency anemia, unspecified: Secondary | ICD-10-CM | POA: Diagnosis not present

## 2017-12-10 DIAGNOSIS — D500 Iron deficiency anemia secondary to blood loss (chronic): Secondary | ICD-10-CM | POA: Diagnosis not present

## 2017-12-17 DIAGNOSIS — D500 Iron deficiency anemia secondary to blood loss (chronic): Secondary | ICD-10-CM | POA: Diagnosis not present

## 2018-01-01 DIAGNOSIS — R32 Unspecified urinary incontinence: Secondary | ICD-10-CM | POA: Diagnosis not present

## 2018-01-01 DIAGNOSIS — Z780 Asymptomatic menopausal state: Secondary | ICD-10-CM | POA: Diagnosis not present

## 2018-01-01 DIAGNOSIS — Z90710 Acquired absence of both cervix and uterus: Secondary | ICD-10-CM | POA: Diagnosis not present

## 2018-01-01 DIAGNOSIS — Z01419 Encounter for gynecological examination (general) (routine) without abnormal findings: Secondary | ICD-10-CM | POA: Diagnosis not present

## 2018-01-13 DIAGNOSIS — I10 Essential (primary) hypertension: Secondary | ICD-10-CM | POA: Diagnosis not present

## 2018-01-13 DIAGNOSIS — E1165 Type 2 diabetes mellitus with hyperglycemia: Secondary | ICD-10-CM | POA: Diagnosis not present

## 2018-01-13 DIAGNOSIS — D696 Thrombocytopenia, unspecified: Secondary | ICD-10-CM | POA: Diagnosis not present

## 2018-01-13 DIAGNOSIS — Z794 Long term (current) use of insulin: Secondary | ICD-10-CM | POA: Diagnosis not present

## 2018-01-13 DIAGNOSIS — E039 Hypothyroidism, unspecified: Secondary | ICD-10-CM | POA: Diagnosis not present

## 2018-01-13 DIAGNOSIS — E7800 Pure hypercholesterolemia, unspecified: Secondary | ICD-10-CM | POA: Diagnosis not present

## 2018-01-24 IMAGING — CT CT HEAD W/O CM
3 of 4 series · 15 of 47 positions shown, 18 images · non-contrast
Comparison: None.

CLINICAL DATA: Altered mental status/confusion

EXAM:
CT HEAD WITHOUT CONTRAST
TECHNIQUE: Contiguous axial images were obtained from the base of the skull
through the vertex without intravenous contrast.

[Series 2: head w/o · axial · non-contrast · 0.45mm/px · z∈[+1518,+1638]mm · 9 of 30 slices shown, 12 images]
[im 3/30  brain]
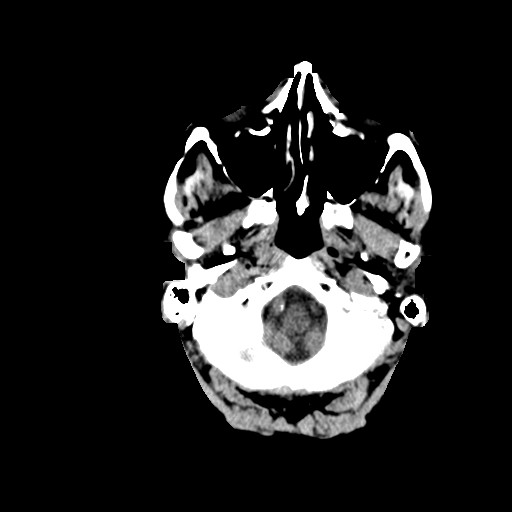
[im 3/30  bone]
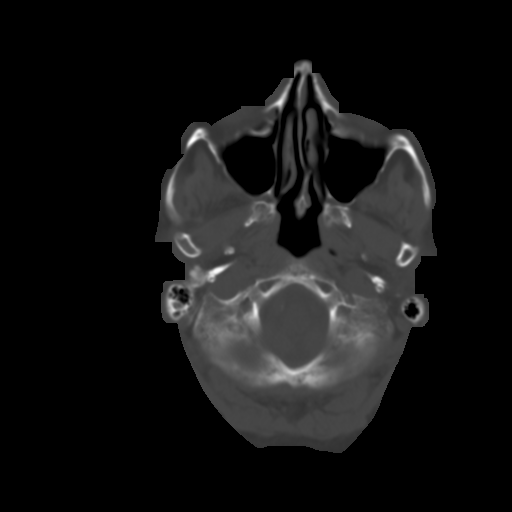
[im 7/30  brain]
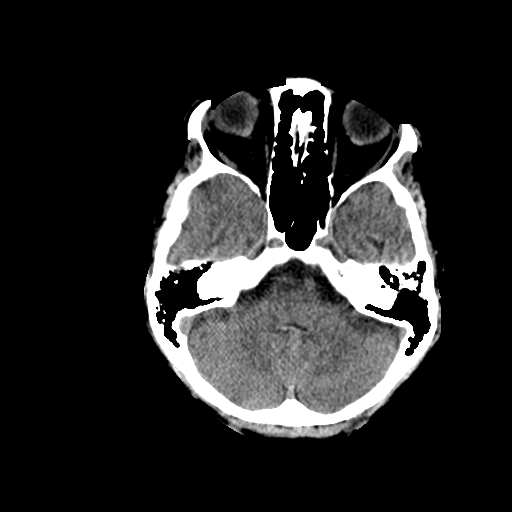
[im 9/30  brain]
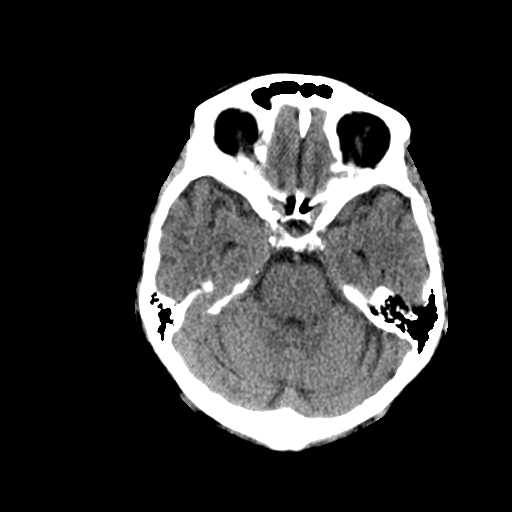
[im 13/30  brain]
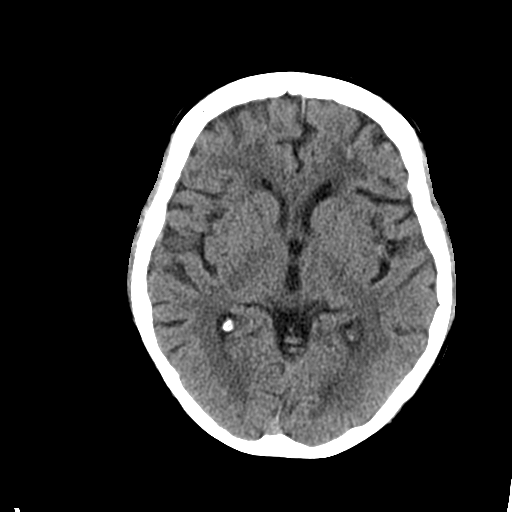
[im 15/30  brain]
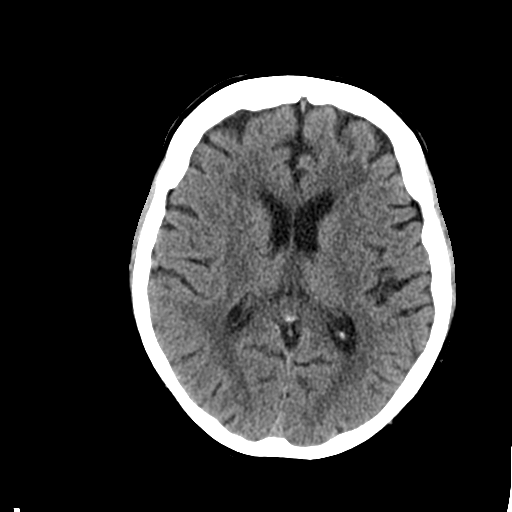
[im 15/30  bone]
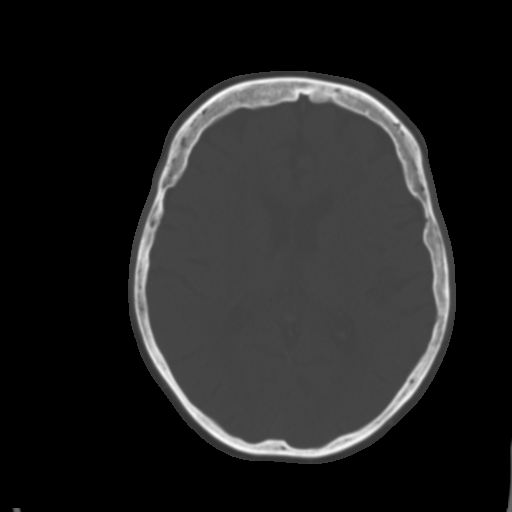
[im 17/30  brain]
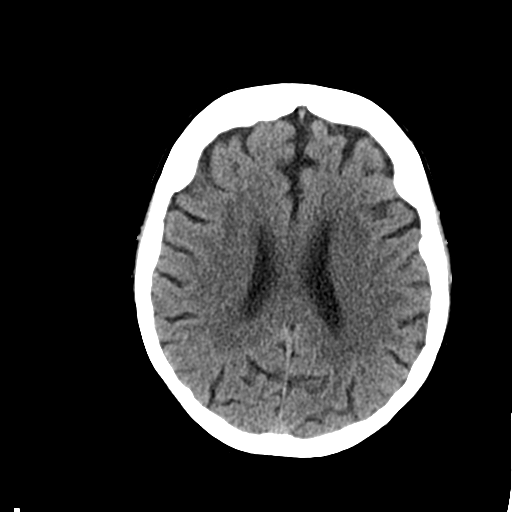
[im 21/30  brain]
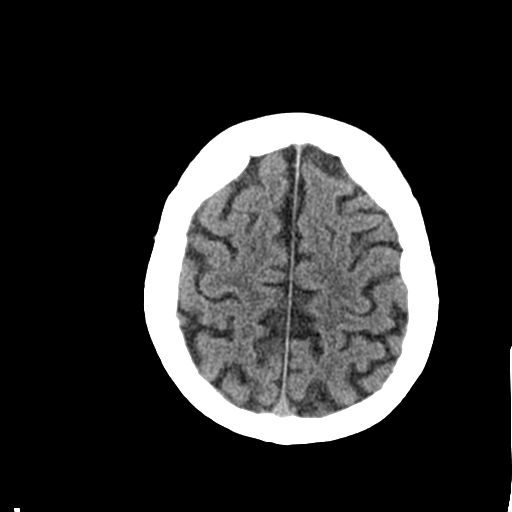
[im 23/30  brain]
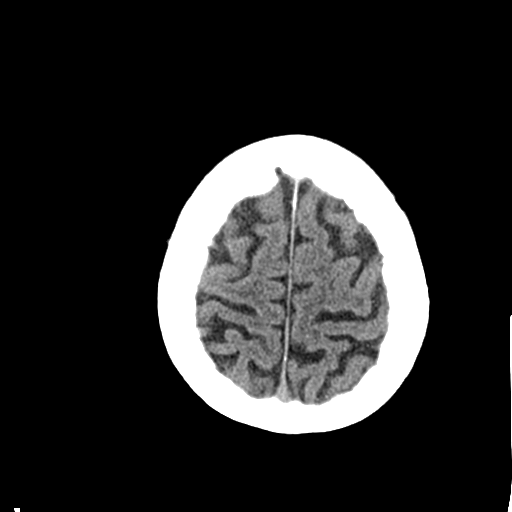
[im 27/30  brain]
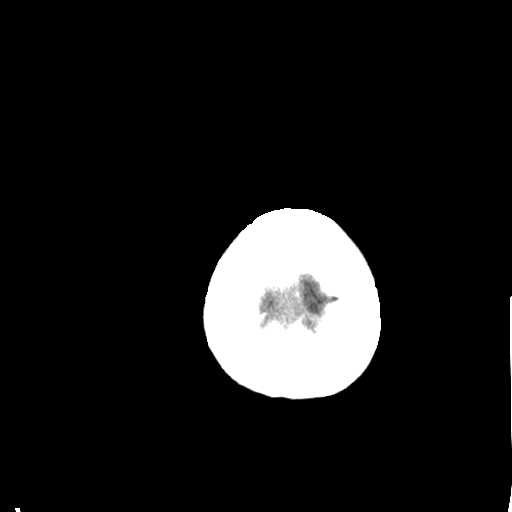
[im 27/30  bone]
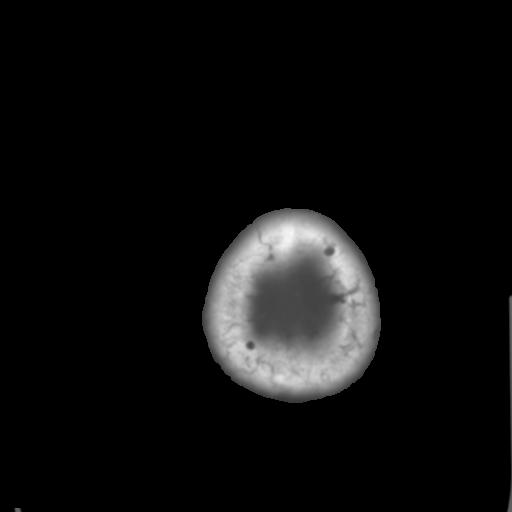

[Series 4: coronal · coronal · 0.34mm/px · 3 of 59 slices shown]
[im 20/59  brain]
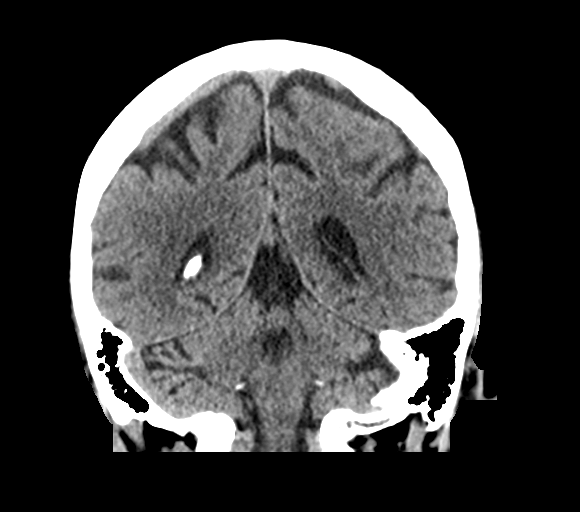
[im 26/59  brain]
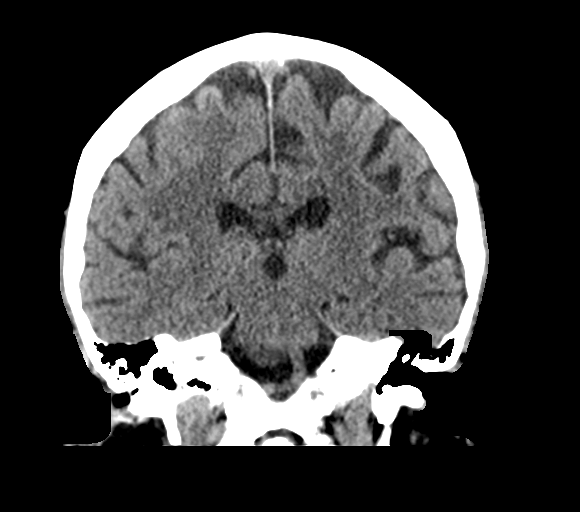
[im 33/59  brain]
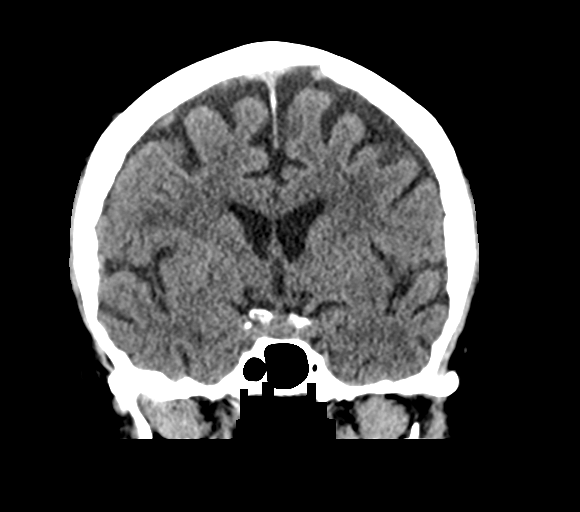

[Series 5: sagittal · sagittal · 0.35mm/px · 3 of 49 slices shown]
[im 17/49  brain]
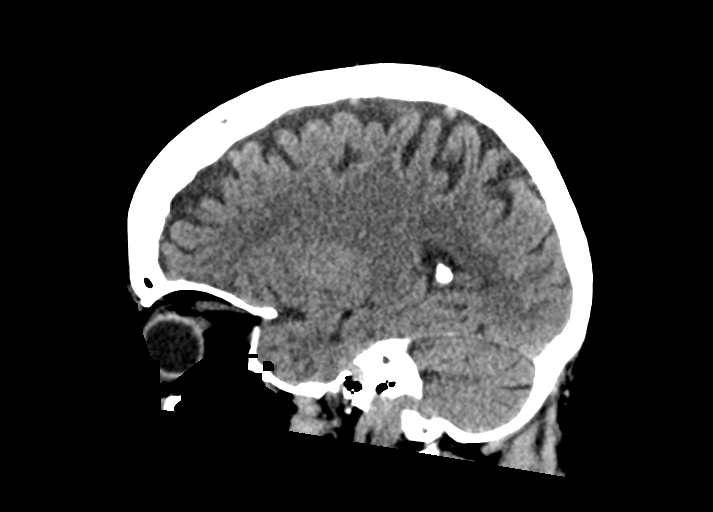
[im 25/49  brain]
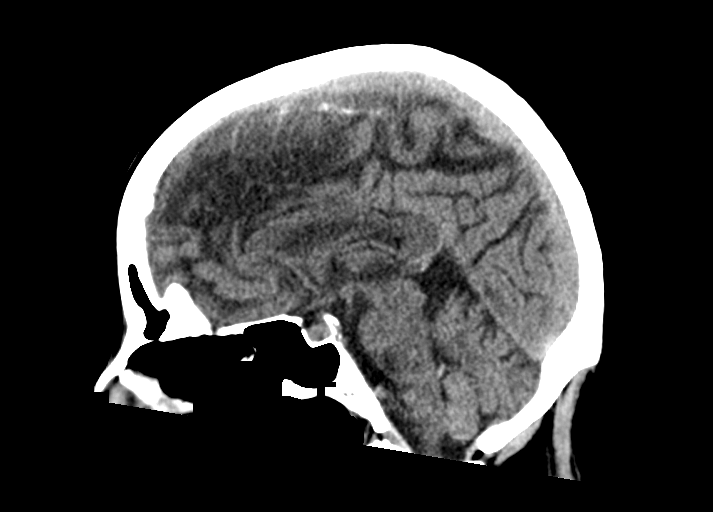
[im 33/49  brain]
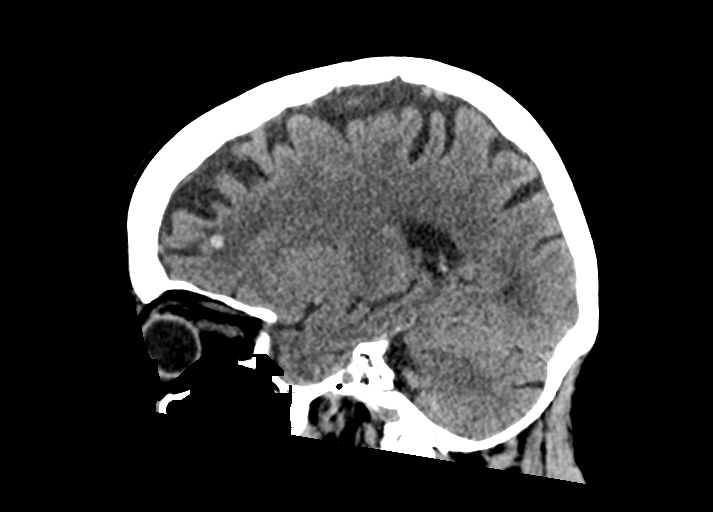

[15 of 47 positions shown; findings below may reference images not displayed]

FINDINGS: Brain: There is age related volume loss. There is no intracranial
mass, hemorrhage, extra-axial fluid collection, or midline shift.
There is patchy small vessel disease in the centra semiovale
bilaterally. Elsewhere gray-white compartments appear normal. There
is no evident acute infarct.

Vascular: There is no hyperdense vessel appreciable. There are foci
of calcification in each carotid siphon region.

Skull: Bony calvarium appears intact.

Sinuses/Orbits: There is a small benign osteoma in the posterior
right ethmoid air cell complex medially. There is slight mucosal
thickening of several ethmoid air cells. Visualized paranasal
sinuses elsewhere are clear. Orbits appear symmetric bilaterally.

Other: Mastoid air cells are clear.
IMPRESSION: Age related volume loss with patchy periventricular small vessel
disease. No intracranial mass, hemorrhage, extra-axial fluid
collection. No acute appearing infarct. Foci of calcification in the
carotid siphon regions. There is mild ethmoid sinus disease
bilaterally.

## 2018-02-10 DIAGNOSIS — L821 Other seborrheic keratosis: Secondary | ICD-10-CM | POA: Diagnosis not present

## 2018-03-05 DIAGNOSIS — M2012 Hallux valgus (acquired), left foot: Secondary | ICD-10-CM | POA: Diagnosis not present

## 2018-03-05 DIAGNOSIS — M19072 Primary osteoarthritis, left ankle and foot: Secondary | ICD-10-CM | POA: Diagnosis not present

## 2019-08-02 ENCOUNTER — Other Ambulatory Visit: Payer: Self-pay

## 2019-08-02 ENCOUNTER — Encounter (HOSPITAL_COMMUNITY): Payer: Self-pay | Admitting: Emergency Medicine

## 2019-08-02 ENCOUNTER — Emergency Department (HOSPITAL_COMMUNITY): Payer: Medicare HMO

## 2019-08-02 ENCOUNTER — Emergency Department (HOSPITAL_COMMUNITY)
Admission: EM | Admit: 2019-08-02 | Discharge: 2019-08-02 | Disposition: A | Discharge status: Home / Self Care | Payer: Medicare HMO | Attending: Emergency Medicine | Admitting: Emergency Medicine

## 2019-08-02 DIAGNOSIS — Z87891 Personal history of nicotine dependence: Secondary | ICD-10-CM | POA: Insufficient documentation

## 2019-08-02 DIAGNOSIS — Z79899 Other long term (current) drug therapy: Secondary | ICD-10-CM | POA: Insufficient documentation

## 2019-08-02 DIAGNOSIS — I10 Essential (primary) hypertension: Secondary | ICD-10-CM | POA: Insufficient documentation

## 2019-08-02 DIAGNOSIS — E039 Hypothyroidism, unspecified: Secondary | ICD-10-CM | POA: Insufficient documentation

## 2019-08-02 DIAGNOSIS — E119 Type 2 diabetes mellitus without complications: Secondary | ICD-10-CM | POA: Insufficient documentation

## 2019-08-02 DIAGNOSIS — R079 Chest pain, unspecified: Secondary | ICD-10-CM | POA: Diagnosis present

## 2019-08-02 DIAGNOSIS — Z794 Long term (current) use of insulin: Secondary | ICD-10-CM | POA: Diagnosis not present

## 2019-08-02 LAB — COMPREHENSIVE METABOLIC PANEL
ALT: 21 U/L (ref 0–44)
AST: 41 U/L (ref 15–41)
Albumin: 3 g/dL — ABNORMAL LOW (ref 3.5–5.0)
Alkaline Phosphatase: 70 U/L (ref 38–126)
Anion gap: 9 (ref 5–15)
BUN: 10 mg/dL (ref 8–23)
CO2: 23 mmol/L (ref 22–32)
Calcium: 8.6 mg/dL — ABNORMAL LOW (ref 8.9–10.3)
Chloride: 108 mmol/L (ref 98–111)
Creatinine, Ser: 0.42 mg/dL — ABNORMAL LOW (ref 0.44–1.00)
GFR calc Af Amer: >60 mL/min (ref >60)
GFR calc non Af Amer: >60 mL/min (ref >60)
Glucose, Bld: 330 mg/dL — ABNORMAL HIGH (ref 70–99)
Potassium: 4.2 mmol/L (ref 3.5–5.1)
Sodium: 140 mmol/L (ref 135–145)
Total Bilirubin: 1.3 mg/dL — ABNORMAL HIGH (ref 0.3–1.2)
Total Protein: 6.1 g/dL — ABNORMAL LOW (ref 6.5–8.1)

## 2019-08-02 LAB — CBC
HCT: 41.6 % (ref 36.0–46.0)
Hemoglobin: 12.8 g/dL (ref 12.0–15.0)
MCH: 27.7 pg (ref 26.0–34.0)
MCHC: 30.8 g/dL (ref 30.0–36.0)
MCV: 90 fL (ref 80.0–100.0)
Platelets: 66 10*3/uL — ABNORMAL LOW (ref 150–400)
RBC: 4.62 MIL/uL (ref 3.87–5.11)
RDW: 16.1 % — ABNORMAL HIGH (ref 11.5–15.5)
WBC: 2.6 10*3/uL — ABNORMAL LOW (ref 4.0–10.5)
nRBC: 0 % (ref 0.0–0.2)

## 2019-08-02 LAB — TROPONIN I (HIGH SENSITIVITY)
Troponin I (High Sensitivity): 8 ng/L (ref <18)
Troponin I (High Sensitivity): 11 ng/L (ref <18)

## 2019-08-02 MED ORDER — OMEPRAZOLE 20 MG PO CPDR: 20.0000 mg | DELAYED_RELEASE_CAPSULE | Freq: Every day | ORAL | 0 refills | Status: DC

## 2019-08-02 MED ORDER — SODIUM CHLORIDE 0.9% FLUSH: 3.0000 mL | Freq: Once | INTRAVENOUS | Status: DC

## 2019-08-02 MED ORDER — LIDOCAINE VISCOUS HCL 2 % MT SOLN
15.0000 mL | Freq: Once | OROMUCOSAL | Status: AC
  Administered 2019-08-02: 15 mL via ORAL
  Filled 2019-08-02: qty 15

## 2019-08-02 MED ORDER — ALUM & MAG HYDROXIDE-SIMETH 200-200-20 MG/5ML PO SUSP
30.0000 mL | Freq: Once | ORAL | Status: AC
  Administered 2019-08-02: 30 mL via ORAL
  Filled 2019-08-02: qty 30

## 2019-08-02 NOTE — ED Notes (Signed)
Kimberly [name] spouse [phone].

## 2019-08-02 NOTE — ED Notes (Signed)
Discharge instructions discussed with Pt. Pt verbalized understanding. Pt stable and leaving via WC.    

## 2019-08-02 NOTE — ED Provider Notes (Addendum)
[facility] EMERGENCY DEPARTMENT Provider Note  CSN: [id number] Arrival date & time: 08/02/19 0272  Chief Complaint(s) Chest Pain  HPI Kimberly [name] is a 78 y.o. female with a history of hypertension and diabetes who presents to the emergency department with substernal chest pressure that is nonradiating.  Patient reports that the pain woke her up from sleep around 430.  She denies any associated shortness of breath.  No nausea or vomiting.  No abdominal pain.  No recent fevers or infections.  No cough or congestion.  Patient was brought in by EMS and given nitroglycerin which provided no relief.  HPI    Past Medical History Past Medical History:  Diagnosis Date  . Diabetes mellitus without complication (HCC)   . Hypertension    Patient Active Problem List   Diagnosis Date Noted  . Hepatic encephalopathy (HCC) 01/08/2017  . Acute hepatic encephalopathy 01/06/2017  . Cirrhosis (HCC) 01/06/2017  . Uncontrolled type 2 diabetes mellitus with hyperglycemia (HCC) 01/06/2017  . Portal hypertension (HCC) 01/06/2017  . Hyperbilirubinemia 01/06/2017  . Esophageal varices (HCC) 01/06/2017  . Atelectasis 01/06/2017  . Essential hypertension 01/06/2017  . Hypothyroidism, unspecified 01/06/2017   Home Medication(s) Prior to Admission medications   Medication Sig Start Date End Date Taking? Authorizing Provider  Insulin Detemir (LEVEMIR) 100 UNIT/ML Pen Inject 20 Units into the skin daily at 10 pm. 01/12/17  Yes Tat, Onalee Hua, MD  insulin regular (NOVOLIN R,HUMULIN R) 100 units/mL injection Inject into the skin 3 (three) times daily before meals. Use sliding scale provided by physician to administer insulin before meals as needed   Yes [provider]  lactulose (CHRONULAC) 10 GM/15ML solution Take 45 mLs (30 g total) by mouth 3 (three) times daily. Patient taking differently: Take 10 g by mouth 3 (three) times daily.  01/12/17  Yes Tat, Onalee Hua, MD  levothyroxine  (SYNTHROID) 112 MCG tablet Take 112 mcg by mouth daily before breakfast.   Yes [provider]  Omega-3 Fatty Acids (FISH OIL) 1000 MG CPDR Take 1,000 mg by mouth daily.   Yes [provider]  propranolol (INDERAL) 10 MG tablet Take 1 tablet (10 mg total) by mouth daily. 01/12/17  Yes TatOnalee Hua, MD                                                                                                                                    Past Surgical History Past Surgical History:  Procedure Laterality Date  . ABDOMINAL HYSTERECTOMY    . APPENDECTOMY    . CHOLECYSTECTOMY     Family History Family History  Problem Relation Age of Onset  . Cirrhosis Neg Hx     Social History Social History   Tobacco Use  . Smoking status: Former Games developer  . Smokeless tobacco: Never Used  Substance Use Topics  . Alcohol use: No  . Drug use: No   Allergies Aspirin, Atorvastatin, Codeine, Levothyroxine,  Pravastatin, and Propoxyphene  Review of Systems Review of Systems All other systems are reviewed and are negative for acute change except as noted in the HPI  Physical Exam Vital Signs  I have reviewed the triage vital signs BP (!) 146/70   Pulse 90   Resp 15   Ht 5\' 3"  (1.6 m)   Wt 73.5 kg   SpO2 99%   BMI 28.70 kg/m   Physical Exam Vitals reviewed.  Constitutional:      General: She is not in acute distress.    Appearance: She is well-developed. She is not diaphoretic.  HENT:     Head: Normocephalic and atraumatic.     Nose: Nose normal.  Eyes:     General: No scleral icterus.       Right eye: No discharge.        Left eye: No discharge.     Conjunctiva/sclera: Conjunctivae normal.     Pupils: Pupils are equal, round, and reactive to light.  Cardiovascular:     Rate and Rhythm: Normal rate and regular rhythm.     Heart sounds: No murmur. No friction rub. No gallop.   Pulmonary:     Effort: Pulmonary effort is normal. No respiratory distress.     Breath sounds:  Normal breath sounds. No stridor. No rales.  Abdominal:     General: There is no distension.     Palpations: Abdomen is soft.     Tenderness: There is no abdominal tenderness.  Musculoskeletal:        General: No tenderness.     Cervical back: Normal range of motion and neck supple.  Skin:    General: Skin is warm and dry.     Findings: No erythema or rash.  Neurological:     Mental Status: She is alert and oriented to person, place, and time.     ED Results and Treatments Labs (all labs ordered are listed, but only abnormal results are displayed) Labs Reviewed  CBC - Abnormal; Notable for the following components:      Result Value   WBC 2.6 (*)    RDW 16.1 (*)    Platelets 66 (*)    All other components within normal limits  COMPREHENSIVE METABOLIC PANEL - Abnormal; Notable for the following components:   Glucose, Bld 330 (*)    Creatinine, Ser 0.42 (*)    Calcium 8.6 (*)    Total Protein 6.1 (*)    Albumin 3.0 (*)    Total Bilirubin 1.3 (*)    All other components within normal limits  TROPONIN I (HIGH SENSITIVITY)  TROPONIN I (HIGH SENSITIVITY)                                                                                                                         EKG  EKG Interpretation  Date/Time:  Tuesday August 02 2019 08:31:22 EST Ventricular Rate:  91 PR Interval:    QRS Duration: 89 QT Interval:  377 QTC Calculation: 464 R Axis:   -10 Text Interpretation: Sinus rhythm Borderline prolonged PR interval Biatrial enlargement LVH with secondary repolarization abnormality NO STEMI. Confirmed by Drema Pry [phone]) on 08/02/2019 8:43:57 AM      Radiology DG Chest 2 View  Result Date: 08/02/2019 CLINICAL DATA:  Chest pain EXAM: CHEST - 2 VIEW COMPARISON:  01/06/2017 FINDINGS: Stable generous heart size with mild aortic tortuosity. There is no edema, consolidation, effusion, or pneumothorax. Artifact from EKG leads.  Cholecystectomy clips. No acute osseous  finding when accounting for chronic changes at the left humeral neck. IMPRESSION: No evidence of active disease. Electronically Signed   By: Marnee Spring M.D.   On: 08/02/2019 06:15    Pertinent labs & imaging results that were available during my care of the patient were reviewed by me and considered in my medical decision making (see chart for details).  Medications Ordered in ED Medications  sodium chloride flush (NS) 0.9 % injection 3 mL (has no administration in time range)  alum & mag hydroxide-simeth (MAALOX/MYLANTA) 200-200-20 MG/5ML suspension 30 mL (30 mLs Oral Given 08/02/19 0630)    And  lidocaine (XYLOCAINE) 2 % viscous mouth solution 15 mL (15 mLs Oral Given 08/02/19 0630)                                                                                                                                    Procedures Procedures  (including critical care time)  Medical Decision Making / ED Course I have reviewed the nursing notes for this encounter and the patient's prior records (if available in EHR or on provided paperwork).   Kimberly [name] was evaluated in Emergency Department on 08/02/2019 for the symptoms described in the history of present illness. She was evaluated in the context of the global COVID-19 pandemic, which necessitated consideration that the patient might be at risk for infection with the SARS-CoV-2 virus that causes COVID-19. Institutional protocols and algorithms that pertain to the evaluation of patients at risk for COVID-19 are in a state of rapid change based on information released by regulatory bodies including the CDC and federal and state organizations. These policies and algorithms were followed during the patient's care in the ED.  EKG with T wave inversions in the lateral leads.  When compared to readings from previous tracings from outside hospital.  That reported T wave inversions in the lateral leads.  Initial troponin was negative.  Rest of the  labs are reassuring and close to patient's baseline.  Patient was provided with GI cocktail which completely resolved her pain.  This is likely to be a cause of her pain but she will await a delta troponin to ensure this is not an atypical ACS.  Low suspicion for pulmonary embolism.  Presentation not classic for aortic dissection or esophageal perforation.  Chest x-ray without evidence suggestive of pneumonia, pneumothorax, pneumomediastinum.  No abnormal contour of the mediastinum  to suggest dissection. No evidence of acute injuries.  Patient care turned over to Dr Rubin PayorPickering. Patient case and results discussed in detail; please see their note for further ED managment.            This chart was dictated using voice recognition software.  Despite best efforts to proofread,  errors can occur which can change the documentation meaning.     Nira Connardama, Kaine Mcquillen Eduardo, MD 08/02/19 [phone]

## 2019-08-02 NOTE — ED Notes (Signed)
Pt transported to Xray. 

## 2019-08-02 NOTE — ED Triage Notes (Signed)
Pt brought to ED by [facility] from [facility] for c/o left side cp radiating to her back that woke her up this morning with some nausea, on EMS arrival pt had a near syncope episode. BP 136/74, HR 90, SPO2 94%RA CBG 356, EMS EKG shows some depression on  V5-V6 and AVL. 251mL NS, 2 nitro sl and 324 mg ASA given by EMS pta.with no relief.

## 2019-08-02 NOTE — ED Notes (Signed)
Daughter - [phone]- please call with disposition

## 2019-08-02 NOTE — ED Provider Notes (Signed)
  Physical Exam  BP (!) 169/86   Pulse 91   Resp 17   Ht 5\' 3"  (1.6 m)   Wt 73.5 kg   SpO2 98%   BMI 28.70 kg/m   Physical Exam  ED Course/Procedures     Procedures  MDM  Received patient in signout.  Left-sided chest pain.  Resolved now.  Resolved after GI cocktail.  No change after nitro.  EKG stable from prior.  Reviewed reports from previous EKGs.  Has stable troponins.  Will discharge home with outpatient follow-up.       Davonna Belling, MD 08/02/19 [phone]

## 2019-08-02 NOTE — Discharge Instructions (Signed)
Follow up with your doctor

## 2019-10-12 ENCOUNTER — Non-Acute Institutional Stay (SKILLED_NURSING_FACILITY): Payer: Medicare HMO | Admitting: Internal Medicine

## 2019-10-12 ENCOUNTER — Emergency Department (HOSPITAL_COMMUNITY): Payer: Medicare HMO

## 2019-10-12 ENCOUNTER — Encounter: Payer: Self-pay | Admitting: Internal Medicine

## 2019-10-12 ENCOUNTER — Emergency Department (HOSPITAL_COMMUNITY)
Admission: EM | Admit: 2019-10-12 | Discharge: 2019-10-13 | Disposition: A | Discharge status: Home / Self Care | Payer: Medicare HMO | Attending: Emergency Medicine | Admitting: Emergency Medicine

## 2019-10-12 ENCOUNTER — Other Ambulatory Visit: Payer: Self-pay | Admitting: Internal Medicine

## 2019-10-12 ENCOUNTER — Other Ambulatory Visit: Payer: Self-pay

## 2019-10-12 DIAGNOSIS — I10 Essential (primary) hypertension: Secondary | ICD-10-CM

## 2019-10-12 DIAGNOSIS — E119 Type 2 diabetes mellitus without complications: Secondary | ICD-10-CM | POA: Diagnosis not present

## 2019-10-12 DIAGNOSIS — E039 Hypothyroidism, unspecified: Secondary | ICD-10-CM | POA: Insufficient documentation

## 2019-10-12 DIAGNOSIS — Z79899 Other long term (current) drug therapy: Secondary | ICD-10-CM | POA: Insufficient documentation

## 2019-10-12 DIAGNOSIS — E7220 Disorder of urea cycle metabolism, unspecified: Secondary | ICD-10-CM | POA: Diagnosis not present

## 2019-10-12 DIAGNOSIS — Z9049 Acquired absence of other specified parts of digestive tract: Secondary | ICD-10-CM | POA: Insufficient documentation

## 2019-10-12 DIAGNOSIS — Z794 Long term (current) use of insulin: Secondary | ICD-10-CM | POA: Insufficient documentation

## 2019-10-12 DIAGNOSIS — M25552 Pain in left hip: Secondary | ICD-10-CM

## 2019-10-12 DIAGNOSIS — S72002D Fracture of unspecified part of neck of left femur, subsequent encounter for closed fracture with routine healing: Secondary | ICD-10-CM | POA: Diagnosis not present

## 2019-10-12 DIAGNOSIS — Z87891 Personal history of nicotine dependence: Secondary | ICD-10-CM | POA: Insufficient documentation

## 2019-10-12 DIAGNOSIS — E1165 Type 2 diabetes mellitus with hyperglycemia: Secondary | ICD-10-CM

## 2019-10-12 MED ORDER — GLUCAGON (RDNA) 1 MG IJ KIT
1.00 | PACK | INTRAMUSCULAR | Status: DC
Start: ? — End: 2019-10-12

## 2019-10-12 MED ORDER — PANTOPRAZOLE SODIUM 40 MG PO TBEC
40.00 | DELAYED_RELEASE_TABLET | ORAL | Status: DC
Start: 2019-10-12 — End: 2019-10-12

## 2019-10-12 MED ORDER — PRAMIPEXOLE DIHYDROCHLORIDE 0.25 MG PO TABS
1.00 | ORAL_TABLET | ORAL | Status: DC
Start: 2019-10-11 — End: 2019-10-12

## 2019-10-12 MED ORDER — LISINOPRIL 20 MG PO TABS
20.00 | ORAL_TABLET | ORAL | Status: DC
Start: 2019-10-12 — End: 2019-10-12

## 2019-10-12 MED ORDER — HYDROCODONE-ACETAMINOPHEN 5-325 MG PO TABS
1.00 | ORAL_TABLET | ORAL | Status: DC
Start: ? — End: 2019-10-12

## 2019-10-12 MED ORDER — CHOLECALCIFEROL 25 MCG (1000 UT) PO TABS
1000.00 | ORAL_TABLET | ORAL | Status: DC
Start: 2019-10-12 — End: 2019-10-12

## 2019-10-12 MED ORDER — ENOXAPARIN SODIUM 40 MG/0.4ML ~~LOC~~ SOLN
40.00 | SUBCUTANEOUS | Status: DC
Start: 2019-10-12 — End: 2019-10-12

## 2019-10-12 MED ORDER — LACTULOSE 10 GM/15ML PO SOLN
40.00 | ORAL | Status: DC
Start: 2019-10-11 — End: 2019-10-12

## 2019-10-12 MED ORDER — MELATONIN 3 MG PO TABS
3.00 | ORAL_TABLET | ORAL | Status: DC
Start: ? — End: 2019-10-12

## 2019-10-12 MED ORDER — HYDRALAZINE HCL 20 MG/ML IJ SOLN
5.00 | INTRAMUSCULAR | Status: DC
Start: ? — End: 2019-10-12

## 2019-10-12 MED ORDER — TRAMADOL HCL 50 MG PO TABS: 50.0000 mg | ORAL_TABLET | Freq: Four times a day (QID) | ORAL | 0 refills | Status: AC | PRN

## 2019-10-12 MED ORDER — ACETAMINOPHEN 325 MG PO TABS
650.00 | ORAL_TABLET | ORAL | Status: DC
Start: ? — End: 2019-10-12

## 2019-10-12 MED ORDER — MAGNESIUM OXIDE 400 MG PO TABS
400.00 | ORAL_TABLET | ORAL | Status: DC
Start: 2019-10-12 — End: 2019-10-12

## 2019-10-12 MED ORDER — ONDANSETRON HCL 4 MG/2ML IJ SOLN
4.00 | INTRAMUSCULAR | Status: DC
Start: ? — End: 2019-10-12

## 2019-10-12 MED ORDER — GLUCOSE 40 % PO GEL
15.00 | ORAL | Status: DC
Start: ? — End: 2019-10-12

## 2019-10-12 MED ORDER — DEXTROSE 10 % IV SOLN
125.00 | INTRAVENOUS | Status: DC
Start: ? — End: 2019-10-12

## 2019-10-12 MED ORDER — RIFAXIMIN 550 MG PO TABS
550.00 | ORAL_TABLET | ORAL | Status: DC
Start: 2019-10-11 — End: 2019-10-12

## 2019-10-12 MED ORDER — INSULIN LISPRO 100 UNIT/ML ~~LOC~~ SOLN
2.00 | SUBCUTANEOUS | Status: DC
Start: 2019-10-11 — End: 2019-10-12

## 2019-10-12 MED ORDER — LEVOTHYROXINE SODIUM 112 MCG PO TABS
112.00 | ORAL_TABLET | ORAL | Status: DC
Start: 2019-10-12 — End: 2019-10-12

## 2019-10-12 NOTE — ED Triage Notes (Signed)
79 yo female BIB [facility] from [facility]. Pt broke her  Left hip on 10/06/19 and hast hip replacement surgery 10/06/19 per EMS.. Pt c/o left hip pain. Pt had xray at facility and it showed air surrounding the tissue, so facility sent her to be evaluated for infection, per EMS. Pt was given tramadol prior to ED arrival, unknown amount and time unknown per EMS. Pt has increased pain with movement. Pt is AOx4   Vitals: Bo 126/84 Hr 74 spo2 98% on RA cbg 346 (pt is a type 2 diabetic) Temp 98.1 tympanic

## 2019-10-12 NOTE — ED Notes (Signed)
Pt transported to radiology.

## 2019-10-12 NOTE — Progress Notes (Signed)
Location:    [name] Room Number: 501/P Place of Service:  SNF (31) Provider:  Estill Batten  Patient, No Pcp Per  Patient Care Team: Patient, No Pcp Per as PCP - General (General Practice)  Extended Emergency Contact Information Primary Emergency Contact: Venetia [place] Home Phone: [phone] Relation: Daughter  Code Status:  Full Code Goals of care: Advanced Directive information Advanced Directives 10/12/2019  Does Patient Have a Medical Advance Directive? Yes  Type of Advance Directive (No Data)  Does patient want to make changes to medical advance directive? No - Patient declined  Would patient like information on creating a medical advance directive? -     Chief Complaint  Patient presents with  . Hospitalization Follow-up    Hospitalization Follow Up   with history of left femoral fracture after fall status post repair HPI:  Pt is a 79 y.o. female seen today for a hospital f/u after admission for left hip fracture.  Apparently she fell at home and was found to have a left hip fracture and had a humeral arthroplasty on October 06, 2019.  Postop course apparently was fairly unremarkable and she has been discharged on Lovenox for 2 weeks of therapy and then aspirin 81 mg a day for 4 weeks after Lovenox is completed.  Patient also had elevated ammonia level and was started on rifaximin.  Other diagnoses include diabetes hypothyroidism hypertension hyperlipidemia GERD vitamin D deficiency.  Apparently all these were stable during her hospitalization.  She is here for rehab and her ammonia level also will have to be followed and another level will be drawn it appears on Friday.  Currently she is sitting in her chair comfortably she is pleasant alert-she does complain of increased left hip discomfort.  She has orders for Tylenol.  In regards to her elevated ammonia level in addition to rifaximin 5 mg twice  daily she is on lactulose 30 g 3 times daily.  Regards to diabetes she is on Glucotrol 10 mg twice daily--and   Novolin 70/30 30 units at breakfast and 15 units at supper-she does have an endocrinology follow-up.  She does have a history of hypothyroidism she is on Synthroid 112 mcg a day.  She is also on lisinopril 20 mg a day for hypertension her blood pressure today is 109/67.    Past Medical History:  Diagnosis Date  . Diabetes mellitus without complication (HCC)   . Hypertension    Past Surgical History:  Procedure Laterality Date  . ABDOMINAL HYSTERECTOMY    . APPENDECTOMY    . CHOLECYSTECTOMY      Allergies  Allergen Reactions  . Pravastatin Other (See Comments)    ELEVATED LFT'S  . Atorvastatin Other (See Comments)    Other reaction(s): MYALGIA Other reaction(s): MYALGIA  . Codeine Nausea And Vomiting and Other (See Comments)    nausea Feels sick to her stomach  Altered mental status  . Other Other (See Comments)    N/A  . Aspirin Other (See Comments)    unknown  . Levothyroxine     Other reaction(s): PALPITATIONS  . Pravastatin Sodium Other (See Comments)    Other reaction(s): ELEVATED LFT'S  . Propoxyphene     Allergies as of 10/12/2019      Reactions   Pravastatin Other (See Comments)   ELEVATED LFT'S   Atorvastatin Other (See Comments)   Other reaction(s): MYALGIA Other reaction(s): MYALGIA   Codeine Nausea And Vomiting, Other (See  Comments)   nausea Feels sick to her stomach  Altered mental status   Other Other (See Comments)   N/A   Aspirin Other (See Comments)   unknown   Levothyroxine    Other reaction(s): PALPITATIONS   Pravastatin Sodium Other (See Comments)   Other reaction(s): ELEVATED LFT'S   Propoxyphene       Medication List       Accurate as of October 12, 2019  3:24 PM. If you have any questions, ask your nurse or doctor.        STOP taking these medications   Fish Oil 1000 MG Cpdr Stopped by: Granville Lewis, PA-C    insulin detemir 100 UNIT/ML FlexPen Commonly known as: LEVEMIR Stopped by: Granville Lewis, PA-C   omeprazole 20 MG capsule Commonly known as: PRILOSEC Stopped by: Granville Lewis, PA-C   OVER THE COUNTER MEDICATION Stopped by: Granville Lewis, PA-C   propranolol 10 MG tablet Commonly known as: INDERAL Stopped by: Granville Lewis, PA-C     TAKE these medications   aspirin EC 81 MG tablet 81 mg. TAKE 1 TABLET BY MOUTH ONCE DAILY FOR 3 WEEKS START DATE 10/21/19 (DO NOT CRUSH) Start taking on: October 21, 2019   bisacodyl 10 MG suppository Commonly known as: DULCOLAX If not relieved by MOM, give 10 mg Bisacodyl suppositiory rectally X 1 dose in 24 hours as needed (Do not use constipation standing orders for residents with renal failure/CFR less than 30. Contact MD for orders) (Physician Order)   enoxaparin 40 MG/0.4ML injection Commonly known as: LOVENOX INJECT 0.4ML (40MG ) INTO SKIN EVERY 12 HOURS FOR 10 DAYS FOR DVT PROPHYLAXIS   glipiZIDE 10 MG tablet Commonly known as: GLUCOTROL Take 10 mg by mouth 2 (two) times daily before a meal. FOR DIABETES   insulin NPH-regular Human (70-30) 100 UNIT/ML injection Inject into the skin. :Inject 15 units Sub before supper  Inject 30 units Sub before brealfast   lactulose 10 GM/15ML solution Commonly known as: CHRONULAC Take 45 mLs (30 g total) by mouth 3 (three) times daily.   lisinopril 20 MG tablet Commonly known as: ZESTRIL Take 20 mg by mouth daily. FOR ESSENTIAL HTN   magnesium hydroxide 400 MG/5ML suspension Commonly known as: MILK OF MAGNESIA If no BM in 3 days, give 30 cc Milk of Magnesium p.o. x 1 dose in 24 hours as needed (Do not use standing constipation orders for residents with renal failure CFR less than 30. Contact MD for orders) (Physician Order)   magnesium oxide 400 MG tablet Commonly known as: MAG-OX Take 400 mg by mouth daily.   NON FORMULARY DIET : Regular NAS CCD Heart healthy   potassium chloride SA 20 MEQ  tablet Commonly known as: KLOR-CON Take 20 mEq by mouth 2 (two) times daily.   pramipexole 0.5 MG tablet Commonly known as: MIRAPEX Take 1 mg by mouth at bedtime.   RA SALINE ENEMA RE If not relieved by Biscodyl suppository, give disposable Saline Enema rectally X 1 dose/24 hrs as needed (Do not use constipation standing orders for residents with renal failure/CFR less than 30. Contact MD for orders)(Physician Or   rifaximin 550 MG Tabs tablet Commonly known as: XIFAXAN Take 550 mg by mouth 2 (two) times daily.   Synthroid 112 MCG tablet Generic drug: levothyroxine Take 112 mcg by mouth daily before breakfast.   traMADol 50 MG tablet Commonly known as: ULTRAM Take 50 mg by mouth every 6 (six) hours as needed.   Vitamin D3 25 MCG  tablet Commonly known as: Vitamin D Take 1,000 Units by mouth daily.       Review of Systems   In general she is not complaining of any fever or chills.  Skin does not complain of rashes or itching surgical site has staples in place.  Head ears eyes nose mouth and throat is not complain of visual changes or sore throat.  Respiratory is not complain of being short of breath or having cough.  Cardiac does not complain of chest pain or increasing edema.  GI does not complain of abdominal discomfort nausea vomiting diarrhea constipation.  GU no complaints of dysuria.  Musculoskeletal does complain of left hip discomfort especially with movement.  Neurologic does not complain of dizziness headache numbness or syncope.  And psych does not complain of being depressed or anxious- Immunization History  Administered Date(s) Administered  . Influenza Split 05/22/2011  . Influenza, High Dose Seasonal PF 05/12/2013, 08/30/2014, 06/04/2015, 05/24/2018, 05/25/2019  . Influenza, Seasonal, Injecte, Preservative Fre 05/06/2010, 04/28/2012  . Influenza-Unspecified 05/04/2013, 08/30/2014, 06/04/2015, 03/31/2016, 03/31/2016  . Pneumococcal Conjugate-13  12/14/2014, 12/14/2014  . Pneumococcal Polysaccharide-23 05/06/2010, 05/06/2010, 09/04/2010, 09/04/2010  . Zoster 08/18/2012, 08/18/2012   Pertinent  Health Maintenance Due  Topic Date Due  . FOOT EXAM  04/26/1951  . OPHTHALMOLOGY EXAM  04/26/1951  . URINE MICROALBUMIN  04/26/1951  . DEXA SCAN  04/27/2007  . HEMOGLOBIN A1C  07/10/2017  . INFLUENZA VACCINE  Completed  . PNA vac Low Risk Adult  Completed   No flowsheet data found. Functional Status Survey:    Vitals:   10/12/19 1457  BP: 113/72  Pulse: 70  Resp: 18  Temp: 98 F (36.7 C)  TempSrc: Oral  SpO2: 97%  Weight: 162 lb (73.5 kg)  Height: 5\' 3"  (1.6 m)   Body mass index is 28.7 kg/m. Physical Exam In general this is a pleasant elderly female in no distress.  Her skin is warm and dry surgical site left hip staples are in place I do not see signs of infection with concerning drainage or bleeding or significantly increased erythema.  Eyes visual acuity appears to be intact sclera and conjunctive are clear.  Oropharynx is clear mucous membranes moist.  Chest is clear to auscultation there is no labored breathing.  Heart is regular rate and rhythm without murmur gallop or rub she does not really have significant lower extremity edema pedal pulses are intact.  Abdomen is soft nontender with positive bowel sounds.  Musculoskeletal does move all extremities x4 however with movement of her left leg at the hip.  She does have significant discomfort I do not note any deformity surgical site appears stable.  Neurologic appears grossly intact her speech is clear cannot appreciate lateralizing findings.  Psych she continues to be pleasant and appropriate.   Labs reviewed:  October 11, 2019.  WBC 3.7 hemoglobin 11.0 platelets 78,000. Sodium 137 potassium 3.3 BUN 10 creatinine 0.33.  Albumin 3.3-AST 23-ALT 18.  Magnesium was 1.9 phosphorus 3.3 Recent Labs    08/02/19 0554  NA 140  K 4.2  CL 108  CO2 23   GLUCOSE 330*  BUN 10  CREATININE 0.42*  CALCIUM 8.6*   Recent Labs    08/02/19 0554  AST 41  ALT 21  ALKPHOS 70  BILITOT 1.3*  PROT 6.1*  ALBUMIN 3.0*   Recent Labs    08/02/19 0554  WBC 2.6*  HGB 12.8  HCT 41.6  MCV 90.0  PLT 66*   No results  found for: TSH Lab Results  Component Value Date   HGBA1C 11.5 (H) 01/08/2017   No results found for: CHOL, HDL, LDLCALC, LDLDIRECT, TRIG, CHOLHDL  Significant Diagnostic Results in last 30 days:  DG Chest 2 View  Result Date: 08/02/2019 CLINICAL DATA:  Chest pain EXAM: CHEST - 2 VIEW COMPARISON:  01/06/2017 FINDINGS: Stable generous heart size with mild aortic tortuosity. There is no edema, consolidation, effusion, or pneumothorax. Artifact from EKG leads.  Cholecystectomy clips. No acute osseous finding when accounting for chronic changes at the left humeral neck. IMPRESSION: No evidence of active disease. Electronically Signed   By: Marnee Spring M.D.   On: 08/02/2019 06:15    Assessment/Plan  #1 history of left femoral fracture status post repair she is on Lovenox for DVT prophylaxis until March 18 and then will be on aspirin 81 mg a day for 4 weeks after the Lovenox is completed.  She is having significant pain will update an x-ray to rule out any changes status postop.  Also will write an order for tramadol 50 mg every 6 hours as needed for pain-I do note an allergy to codeine apparently she has nausea with that-I told her to let us know if she has any reaction to the tramadol.  She cannot really recall having nausea but she says it has been a long time.  2.  History of elevated ammonia level she was started on rifaximin 500 mg twice daily in the hospital she is also on lactulose 30 g 3 times daily-updated ammonia level is pending for Friday, March 12.  Clinically she appears to be stable she is pleasant and appropriate.  3.  History of diabetes she does have endocrinology follow-up she is on Glucotrol 10 mg twice  daily and  Novolin 70/30 30 units at breakfast and 15 units at supper-will await input from endocrinology her CBG this morning was 114.  4.  History of hypothyroidism not stated as uncontrolled she is on Synthroid 112 mcg daily.  5.  History of hypertension she is on lisinopril 20 mg a day at this point will monitor blood pressure today is 109/67.  6.  History of restless leg she is on Mirapex  nightly.  7.  History of low magnesium in the hospital she is on supplementation 400 mg daily-magnesium level was 1.9 on hospital discharge.  8.  History of mild hypokalemia with potassium of 3.3 will supplement this with 20 mEq of potassium for a couple days and update metabolic panel  [id number]-of note greater than 35 minutes spent assessing patient-reviewing her chart and labs-and coordinating and formulating a plan of care for numerous diagnoses-of note greater than 50% of time spent coordinating a plan of care with input as noted above

## 2019-10-13 ENCOUNTER — Encounter: Payer: Self-pay | Admitting: Internal Medicine

## 2019-10-13 ENCOUNTER — Non-Acute Institutional Stay (SKILLED_NURSING_FACILITY): Payer: Medicare HMO | Admitting: Internal Medicine

## 2019-10-13 DIAGNOSIS — S72002D Fracture of unspecified part of neck of left femur, subsequent encounter for closed fracture with routine healing: Secondary | ICD-10-CM

## 2019-10-13 DIAGNOSIS — E876 Hypokalemia: Secondary | ICD-10-CM | POA: Diagnosis not present

## 2019-10-13 LAB — CBC WITH DIFFERENTIAL/PLATELET
Abs Immature Granulocytes: 0.05 10*3/uL (ref 0.00–0.07)
Basophils Absolute: 0 10*3/uL (ref 0.0–0.1)
Basophils Relative: 1 %
Eosinophils Absolute: 0.2 10*3/uL (ref 0.0–0.5)
Eosinophils Relative: 3 %
HCT: 37.9 % (ref 36.0–46.0)
Hemoglobin: 12.3 g/dL (ref 12.0–15.0)
Immature Granulocytes: 1 %
Lymphocytes Relative: 11 %
Lymphs Abs: 0.5 10*3/uL — ABNORMAL LOW (ref 0.7–4.0)
MCH: 30.5 pg (ref 26.0–34.0)
MCHC: 32.5 g/dL (ref 30.0–36.0)
MCV: 94 fL (ref 80.0–100.0)
Monocytes Absolute: 0.6 10*3/uL (ref 0.1–1.0)
Monocytes Relative: 13 %
Neutro Abs: 3.1 10*3/uL (ref 1.7–7.7)
Neutrophils Relative %: 71 %
Platelets: 105 10*3/uL — ABNORMAL LOW (ref 150–400)
RBC: 4.03 MIL/uL (ref 3.87–5.11)
RDW: 14.5 % (ref 11.5–15.5)
WBC: 4.4 10*3/uL (ref 4.0–10.5)
nRBC: 0 % (ref 0.0–0.2)

## 2019-10-13 LAB — BASIC METABOLIC PANEL
Anion gap: 9 (ref 5–15)
BUN: 7 mg/dL — ABNORMAL LOW (ref 8–23)
CO2: 28 mmol/L (ref 22–32)
Calcium: 8.1 mg/dL — ABNORMAL LOW (ref 8.9–10.3)
Chloride: 102 mmol/L (ref 98–111)
Creatinine, Ser: 0.34 mg/dL — ABNORMAL LOW (ref 0.44–1.00)
GFR calc Af Amer: >60 mL/min (ref >60)
GFR calc non Af Amer: >60 mL/min (ref >60)
Glucose, Bld: 262 mg/dL — ABNORMAL HIGH (ref 70–99)
Potassium: 3.1 mmol/L — ABNORMAL LOW (ref 3.5–5.1)
Sodium: 139 mmol/L (ref 135–145)

## 2019-10-13 NOTE — ED Notes (Signed)
[facility] at pt bedside for transport back to facility.

## 2019-10-13 NOTE — Progress Notes (Signed)
Location:    [facility] Room Number: 144/R Place of Service:  SNF (31) Provider:  Granville Lewis  Patient, No Pcp Per  Patient Care Team: Patient, No Pcp Per as PCP - General (General Practice)  Extended Emergency Contact Information Primary Emergency Contact: [place] Phone: [phone] Relation: Daughter  Code Status:  Full Code Goals of care: Advanced Directive information Advanced Directives 10/13/2019  Does Patient Have a Medical Advance Directive? Yes  Type of Advance Directive (No Data)  Does patient want to make changes to medical advance directive? No - Patient declined  Would patient like information on creating a medical advance directive? -     Chief Complaint  Patient presents with  . Follow-up    Follow ER  Status post visit for possible gas at surgical site left hip  HPI:  Pt is a 79 y.o. female seen today for an acute visit for follow-up of ER visit last night.  Patient was seen yesterday status post hospitalization visit for left hip fracture that was surgically repaired.  Patient has significant pain and we did order tramadol.  X-ray was ordered which showed possible air at the surgical site-it was unclear whether this was infection or possibly typical postop there.  Since patient was having significant discomfort was sent to the ER out of caution.  ER x-ray was reassuring that this was most likely postop air.  Blood work was stable with a white count within normal range at 4.4.  I do note her potassium was slightly lower at 3.1.  She has returned to the facility appears to be doing better appears to be in somewhat less discomfort today.  Vital signs appear to be stable.  Nursing does not report any acute issues-.  We did order 20 mEq of potassium for a couple days secondary to potassium of 3.3.  But now it appears it is down to 3.1 she will need a little bit more potassium it looks  like.     Past Medical History:  Diagnosis Date  . Diabetes mellitus without complication ([place])   . Hypertension    Past Surgical History:  Procedure Laterality Date  . ABDOMINAL HYSTERECTOMY    . APPENDECTOMY    . CHOLECYSTECTOMY      Allergies  Allergen Reactions  . Pravastatin Other (See Comments)    ELEVATED LFT'S  . Atorvastatin Other (See Comments)    Other reaction(s): MYALGIA Other reaction(s): MYALGIA  . Codeine Nausea And Vomiting and Other (See Comments)    nausea Feels sick to her stomach  Altered mental status  . Other Other (See Comments)    N/A  . Aspirin Other (See Comments)    unknown  . Levothyroxine     Other reaction(s): PALPITATIONS  . Pravastatin Sodium Other (See Comments)    Other reaction(s): ELEVATED LFT'S  . Propoxyphene     Outpatient Encounter Medications as of 10/13/2019  Medication Sig  . [START ON 10/21/2019] aspirin EC 81 MG tablet 81 mg. TAKE 1 TABLET BY MOUTH ONCE DAILY FOR 3 WEEKS START DATE 10/21/19 (DO NOT CRUSH)  . bisacodyl (DULCOLAX) 10 MG suppository If not relieved by MOM, give 10 mg Bisacodyl suppositiory rectally X 1 dose in 24 hours as needed (Do not use constipation standing orders for residents with renal failure/CFR less than 30. Contact MD for orders) (Physician Order)  . enoxaparin (LOVENOX) 40 MG/0.4ML injection INJECT 0.4ML (40MG ) INTO SKIN EVERY 12 HOURS  FOR 10 DAYS FOR DVT PROPHYLAXIS  . glipiZIDE (GLUCOTROL) 10 MG tablet Take 10 mg by mouth 2 (two) times daily before a meal. FOR DIABETES  . insulin NPH-regular Human (70-30) 100 UNIT/ML injection Inject 26 units Subcutaneously with supper  . insulin NPH-regular Human (NOVOLIN 70/30) (70-30) 100 UNIT/ML injection Inject 26 units Subcutaneously with supper  . lactulose (CHRONULAC) 10 GM/15ML solution Take 45 mLs (30 g total) by mouth 3 (three) times daily.  Marland Kitchen levothyroxine (SYNTHROID) 112 MCG tablet Take 112 mcg by mouth daily before breakfast.  . lisinopril (ZESTRIL)  20 MG tablet Take 20 mg by mouth daily. FOR ESSENTIAL HTN  . magnesium hydroxide (MILK OF MAGNESIA) 400 MG/5ML suspension If no BM in 3 days, give 30 cc Milk of Magnesium p.o. x 1 dose in 24 hours as needed (Do not use standing constipation orders for residents with renal failure CFR less than 30. Contact MD for orders) (Physician Order)  . magnesium oxide (MAG-OX) 400 MG tablet Take 400 mg by mouth daily.  . NON FORMULARY DIET : Regular NAS CCD Heart healthy  . potassium chloride SA (KLOR-CON) 20 MEQ tablet Take 20 mEq by mouth 2 (two) times daily.  . pramipexole (MIRAPEX) 0.5 MG tablet Take 1 mg by mouth at bedtime.  . rifaximin (XIFAXAN) 550 MG TABS tablet Take 550 mg by mouth 2 (two) times daily.  . Sodium Phosphates (RA SALINE ENEMA RE) If not relieved by Biscodyl suppository, give disposable Saline Enema rectally X 1 dose/24 hrs as needed (Do not use constipation standing orders for residents with renal failure/CFR less than 30. Contact MD for orders)(Physician Or  . traMADol (ULTRAM) 50 MG tablet Take 1 tablet (50 mg total) by mouth every 6 (six) hours as needed for up to 7 days.  . Vitamin D3 (VITAMIN D) 25 MCG tablet Take 1,000 Units by mouth daily.   No facility-administered encounter medications on file as of 10/13/2019.    Review of Systems   General she is not complaining of any fever or chills.  Skin no complaints of rashes or itching surgical site staples are in place.  Head ears eyes nose mouth and throat is not complain of visual changes or sore throat.  Respiratory is not complain of being short of breath or having a cough.  Cardiac no complaints of chest pain or increased edema.  GI is not complaining of abdominal pain nausea vomiting diarrhea constipation.   Musculoskeletal says her left hip does hurt at times but apparently tramadol is helping.  Neurologic does not complain of dizziness headache numbness does have weakness status post surgery.  And psych is not  complaining of being depressed or anxious continues to be alert pleasant  Immunization History  Administered Date(s) Administered  . Influenza Split 05/22/2011  . Influenza, High Dose Seasonal PF 05/12/2013, 08/30/2014, 06/04/2015, 05/24/2018, 05/25/2019  . Influenza, Seasonal, Injecte, Preservative Fre 05/06/2010, 04/28/2012  . Influenza-Unspecified 05/04/2013, 08/30/2014, 06/04/2015, 03/31/2016, 03/31/2016  . Pneumococcal Conjugate-13 12/14/2014, 12/14/2014  . Pneumococcal Polysaccharide-23 05/06/2010, 05/06/2010, 09/04/2010, 09/04/2010  . Zoster 08/18/2012, 08/18/2012   Pertinent  Health Maintenance Due  Topic Date Due  . FOOT EXAM  Never done  . OPHTHALMOLOGY EXAM  Never done  . DEXA SCAN  Never done  . HEMOGLOBIN A1C  07/10/2017  . INFLUENZA VACCINE  Completed  . PNA vac Low Risk Adult  Completed   No flowsheet data found. Functional Status Survey:    Vitals:   10/13/19 1519  BP: 125/70  Pulse:  70  Resp: 18  Temp: 98 F (36.7 C)  TempSrc: Oral  SpO2: 97%  Weight: 167 lb 9.6 oz (76 kg)  Height: 5\' 3"  (1.6 m)   Body mass index is 29.69 kg/m. Physical Exam In general this is a pleasant elderly female in no distress.  Her skin is warm and dry surgical site staples are in place I do not see signs of infection with concerning edema or erythema.  Eyes visual acuity appears to be intact sclera and conjunctive are clear.  Chest is clear to auscultation there is no labored breathing  Heart is regular rate and rhythm with a systolic murmur 2 out of 6-she does not have significant lower extremity edema pedal pulses are intact.  Abdomen is soft nontender with positive bowel sounds.  Musculoskeletal does move extremities x4 there is still some pain with movement of her left hip but this appears somewhat less dramatic than yesterday.  Neurologic appears grossly intact her speech is clear cannot appreciate lateralizing findings.  Psych she is pleasant and  appropriate.   Labs reviewed: Recent Labs    08/02/19 0554 10/12/19 2308  NA 140 139  K 4.2 3.1*  CL 108 102  CO2 23 28  GLUCOSE 330* 262*  BUN 10 7*  CREATININE 0.42* 0.34*  CALCIUM 8.6* 8.1*   Recent Labs    08/02/19 0554  AST 41  ALT 21  ALKPHOS 70  BILITOT 1.3*  PROT 6.1*  ALBUMIN 3.0*   Recent Labs    08/02/19 0554 10/12/19 2308  WBC 2.6* 4.4  NEUTROABS  --  3.1  HGB 12.8 12.3  HCT 41.6 37.9  MCV 90.0 94.0  PLT 66* 105*   No results found for: TSH Lab Results  Component Value Date   HGBA1C 11.5 (H) 01/08/2017   No results found for: CHOL, HDL, LDLCALC, LDLDIRECT, TRIG, CHOLHDL  Significant Diagnostic Results in last 30 days:  DG Hip Unilat W or Wo Pelvis 2-3 Views Left  Result Date: 10/12/2019 CLINICAL DATA:  Hip pain status post hip replacement EXAM: DG HIP (WITH OR WITHOUT PELVIS) 2-3V LEFT COMPARISON:  10/05/2019, 10/06/2019 FINDINGS: Normal alignment of right hip. Pubic symphysis and rami are intact. Status post left hip replacement with normal alignment. No fracture is seen. Decreased soft tissue gas. IMPRESSION: Status post left hip replacement. No acute osseous abnormality. Small amount of soft tissue gas, likely resolving postoperative air. Electronically Signed   By: 12/06/2019 M.D.   On: 10/12/2019 23:03    Assessment/Plan  #1 history of concerning x-ray for air around surgical site left hip--fixed side x-ray in ER was reassuring-conclusion was this was typical postop air-white count was within normal range she has been afebrile without sign of infection.  At this point will monitor.  She has been started on tramadol 50 mg every 6 hours as needed for pain-this will need to be monitored.  2.  Hypokalemia potassium was 3.1 on ER lab will give potassium 20 mEq twice daily for 2 days and then recheck a lab   She is not on any diuretic she is on lisinopril   12/12/2019

## 2019-10-13 NOTE — ED Notes (Signed)
Report called to nurse Erie Noe for pt return to facility

## 2019-10-13 NOTE — Discharge Instructions (Addendum)
Labs without any significant abnormality.  X-ray here read by radiology no evidence of infection.  They felt that the air in the wound site is consistent with postoperative changes.  Stable for discharge back to facility.  Patient without any leukocytosis no fevers.  Wound appears to be healing well.  Follow-up with orthopedics as scheduled.  Tramadol as needed for pain.  Which patient has already.

## 2019-10-13 NOTE — ED Provider Notes (Signed)
[facility]-EMERGENCY DEPT Provider Note   CSN: [id number] Arrival date & time: 10/12/19  2036     History Chief Complaint  Patient presents with  . Hip Pain    Kimberly [name] is a 79 y.o. female.  Patient sent in from [facility].  Patient sustained a fracture on March 4.  Had hip replacement surgery done at [facility]..  Patient with complaint of left hip pain.  The facility did an x-ray and it showed air surrounding the tissue Cynthia's silly sent her in for evaluation of possible infection.  Patient is being treated with tramadol for the pain.  Patient has increased pain with movement.  No fevers here oxygen saturation is 98%.  Not tachycardic blood pressure normal at 136/60.  Patient has follow-up with her orthopedic doctor in about a week.  Patient states surgery was done at [facility]        Past Medical History:  Diagnosis Date  . Diabetes mellitus without complication (HCC)   . Hypertension     Patient Active Problem List   Diagnosis Date Noted  . Hepatic encephalopathy (HCC) 01/08/2017  . Acute hepatic encephalopathy 01/06/2017  . Cirrhosis (HCC) 01/06/2017  . Uncontrolled type 2 diabetes mellitus with hyperglycemia (HCC) 01/06/2017  . Portal hypertension (HCC) 01/06/2017  . Hyperbilirubinemia 01/06/2017  . Esophageal varices (HCC) 01/06/2017  . Atelectasis 01/06/2017  . Essential hypertension 01/06/2017  . Hypothyroidism, unspecified 01/06/2017    Past Surgical History:  Procedure Laterality Date  . ABDOMINAL HYSTERECTOMY    . APPENDECTOMY    . CHOLECYSTECTOMY       OB History   No obstetric history on file.     Family History  Problem Relation Age of Onset  . Cirrhosis Neg Hx     Social History   Tobacco Use  . Smoking status: Former Games developer  . Smokeless tobacco: Never Used  Substance Use Topics  . Alcohol use: No  . Drug use: No    Home Medications Prior to Admission medications   Medication  Sig Start Date End Date Taking? Authorizing Provider  aspirin EC 81 MG tablet 81 mg. TAKE 1 TABLET BY MOUTH ONCE DAILY FOR 3 WEEKS START DATE 10/21/19 (DO NOT CRUSH) 10/21/19 11/10/19 Yes [provider]  bisacodyl (DULCOLAX) 10 MG suppository If not relieved by MOM, give 10 mg Bisacodyl suppositiory rectally X 1 dose in 24 hours as needed (Do not use constipation standing orders for residents with renal failure/CFR less than 30. Contact MD for orders) (Physician Order)   Yes [provider]  enoxaparin (LOVENOX) 40 MG/0.4ML injection INJECT 0.4ML (40MG ) INTO SKIN EVERY 12 HOURS FOR 10 DAYS FOR DVT PROPHYLAXIS 10/11/19 10/20/19 Yes [provider]  glipiZIDE (GLUCOTROL) 10 MG tablet Take 10 mg by mouth 2 (two) times daily before a meal. FOR DIABETES   Yes [provider]  insulin NPH-regular Human (70-30) 100 UNIT/ML injection Inject 15-30 Units into the skin 2 (two) times daily with a meal. :Inject 15 units Sub before supper  Inject 30 units Sub before brealfast    Yes [provider]  lactulose (CHRONULAC) 10 GM/15ML solution Take 45 mLs (30 g total) by mouth 3 (three) times daily. 01/12/17  Yes Tat, 03/14/17, MD  levothyroxine (SYNTHROID) 112 MCG tablet Take 112 mcg by mouth daily before breakfast.   Yes [provider]  lisinopril (ZESTRIL) 20 MG tablet Take 20 mg by mouth daily. FOR ESSENTIAL HTN   Yes [provider]  magnesium hydroxide (MILK OF MAGNESIA) 400 MG/5ML suspension If no BM in 3 days, give 30 cc Milk of Magnesium p.o. x 1 dose in 24 hours as needed (Do not use standing constipation orders for residents with renal failure CFR less than 30. Contact MD for orders) (Physician Order)   Yes [provider]  magnesium oxide (MAG-OX) 400 MG tablet Take 400 mg by mouth daily.   Yes [provider]  potassium chloride SA (KLOR-CON) 20 MEQ tablet Take 20 mEq by mouth 2 (two) times daily. 10/12/19 10/14/19 Yes [provider]  pramipexole (MIRAPEX) 0.5 MG tablet Take 1 mg by mouth at bedtime.   Yes [provider]  rifaximin (XIFAXAN) 550 MG TABS tablet Take 550 mg by mouth 2 (two) times daily.   Yes [provider]  Sodium Phosphates (RA SALINE ENEMA RE) If not relieved by Biscodyl suppository, give disposable Saline Enema rectally X 1 dose/24 hrs as needed (Do not use constipation standing orders for residents with renal failure/CFR less than 30. Contact MD for orders)(Physician Or   Yes [provider]  traMADol (ULTRAM) 50 MG tablet Take 1 tablet (50 mg total) by mouth every 6 (six) hours as needed for up to 7 days. Patient taking differently: Take 50 mg by mouth every 6 (six) hours as needed for moderate pain.  10/12/19 10/19/19 Yes Lassen, Arlo C, PA-C  Vitamin D3 (VITAMIN D) 25 MCG tablet Take 1,000 Units by mouth daily.   Yes [provider]  NON FORMULARY DIET : Regular NAS CCD Heart healthy    [provider]    Allergies    Pravastatin, Atorvastatin, Codeine, Other, Aspirin, Levothyroxine, Pravastatin sodium, and Propoxyphene  Review of Systems   Review of Systems  Constitutional: Negative for chills and fever.  HENT: Negative for rhinorrhea and sore throat.   Eyes: Negative for visual disturbance.  Respiratory: Negative for cough and shortness of breath.   Cardiovascular: Negative for chest pain and leg swelling.  Gastrointestinal: Negative for abdominal pain, diarrhea, nausea and vomiting.  Genitourinary: Negative for dysuria.  Musculoskeletal: Negative for back pain and neck pain.  Skin: Negative for rash.  Neurological: Negative for dizziness, light-headedness and headaches.  Hematological: Does not bruise/bleed easily.  Psychiatric/Behavioral: Negative for confusion.    Physical Exam Updated Vital Signs BP (!) 141/66   Pulse 73   Temp 98 F (36.7 C) (Oral)   Resp (!) 22   SpO2 98%   Physical Exam Vitals and nursing note  reviewed.  Constitutional:      General: She is not in acute distress.    Appearance: She is well-developed.  HENT:     Head: Normocephalic and atraumatic.  Eyes:     Extraocular Movements: Extraocular movements intact.     Conjunctiva/sclera: Conjunctivae normal.     Pupils: Pupils are equal, round, and reactive to light.  Cardiovascular:     Rate and Rhythm: Normal rate and regular rhythm.     Heart sounds: No murmur.  Pulmonary:     Effort: Pulmonary effort is normal. No respiratory distress.     Breath sounds: Normal breath sounds.  Abdominal:     Palpations: Abdomen is soft.     Tenderness: There is no abdominal tenderness.  Musculoskeletal:     Cervical back: Neck supple.     Comments: Left dorsalis pedis is 1+.  Patient with some pain with range of motion and thigh on left hip thigh area.  Surgical  staples still in place.  Some slight erythema just on the edge of the wound.  No significant erythema.  No discharge.  Wound appears to be healing well.  Skin:    General: Skin is warm and dry.     Capillary Refill: Capillary refill takes less than 2 seconds.  Neurological:     General: No focal deficit present.     Mental Status: She is alert and oriented to person, place, and time.     Cranial Nerves: No cranial nerve deficit.     Sensory: No sensory deficit.     Motor: No weakness.     ED Results / Procedures / Treatments   Labs (all labs ordered are listed, but only abnormal results are displayed) Labs Reviewed  CBC WITH DIFFERENTIAL/PLATELET - Abnormal; Notable for the following components:      Result Value   Platelets 105 (*)    Lymphs Abs 0.5 (*)    All other components within normal limits  BASIC METABOLIC PANEL - Abnormal; Notable for the following components:   Potassium 3.1 (*)    Glucose, Bld 262 (*)    BUN 7 (*)    Creatinine, Ser 0.34 (*)    Calcium 8.1 (*)    All other components within normal limits    EKG None  Radiology DG Hip Unilat W or Wo  Pelvis 2-3 Views Left  Result Date: 10/12/2019 CLINICAL DATA:  Hip pain status post hip replacement EXAM: DG HIP (WITH OR WITHOUT PELVIS) 2-3V LEFT COMPARISON:  10/05/2019, 10/06/2019 FINDINGS: Normal alignment of right hip. Pubic symphysis and rami are intact. Status post left hip replacement with normal alignment. No fracture is seen. Decreased soft tissue gas. IMPRESSION: Status post left hip replacement. No acute osseous abnormality. Small amount of soft tissue gas, likely resolving postoperative air. Electronically Signed   By: Donavan Foil M.D.   On: 10/12/2019 23:03    Procedures Procedures (including critical care time)  Medications Ordered in ED Medications - No data to display  ED Course  I have reviewed the triage vital signs and the nursing notes.  Pertinent labs & imaging results that were available during my care of the patient were reviewed by me and considered in my medical decision making (see chart for details).    MDM Rules/Calculators/A&P                      X-rays of the left hip read by radiology no real acute findings concern for infection.  They feel little bit of the free air that is there is secondary to postoperative changes.  Patient without fever.  No leukocytosis.  No evidence of infection of the wound.  Feel that patient safe for discharge back continue treatment with tramadol follow-up with orthopedics.    Final Clinical Impression(s) / ED Diagnoses Final diagnoses:  Left hip pain    Rx / DC Orders ED Discharge Orders    None       Fredia Sorrow, MD 10/13/19 [phone]

## 2019-10-14 ENCOUNTER — Encounter: Payer: Self-pay | Admitting: Internal Medicine

## 2019-10-14 ENCOUNTER — Non-Acute Institutional Stay (SKILLED_NURSING_FACILITY): Payer: Medicare HMO | Admitting: Internal Medicine

## 2019-10-14 DIAGNOSIS — R7989 Other specified abnormal findings of blood chemistry: Secondary | ICD-10-CM

## 2019-10-14 DIAGNOSIS — I10 Essential (primary) hypertension: Secondary | ICD-10-CM

## 2019-10-14 DIAGNOSIS — E1165 Type 2 diabetes mellitus with hyperglycemia: Secondary | ICD-10-CM | POA: Diagnosis not present

## 2019-10-14 DIAGNOSIS — S72002D Fracture of unspecified part of neck of left femur, subsequent encounter for closed fracture with routine healing: Secondary | ICD-10-CM

## 2019-10-14 DIAGNOSIS — E039 Hypothyroidism, unspecified: Secondary | ICD-10-CM | POA: Diagnosis not present

## 2019-10-14 DIAGNOSIS — K7460 Unspecified cirrhosis of liver: Secondary | ICD-10-CM

## 2019-10-14 NOTE — Progress Notes (Signed)
: Provider:  Margit Hanks., MD Location:  [facility] Room Number: 501-P Place of Service:  SNF (31)  PCP: Patient, No Pcp Per Patient Care Team: Patient, No Pcp Per as PCP - General (General Practice)  Extended Emergency Contact Information Primary Emergency Contact: Venetia [place] Home Phone: [phone] Relation: Daughter     Allergies: Pravastatin, Atorvastatin, Codeine, Other, Aspirin, Levothyroxine, Pravastatin sodium, and Propoxyphene  Chief Complaint  Patient presents with  . New Admit To SNF    New admission to [facility]    HPI: Patient is a 79 y.o. female with diabetes mellitus type 2, hypothyroidism, hypertension, hyperlipidemia, GERD, and vitamin D deficiency who presented to [facility] after having fallen at home.  In the ED she was found to have a left hip fracture.  Patient was admitted to [facility] from 3/3-9.  Postop course was unremarkable.  Patient was also noted to have an elevated ammonia level was started on rifaximin and lactulose.  Patient is admitted to skilled nursing facility for OT/PT.  While at skilled nursing facility patient will be followed for hypothyroidism treated with Synthroid, hypertension treated with lisinopril and diabetes mellitus treated with Glucotrol and insulin.  Past Medical History:  Diagnosis Date  . Diabetes mellitus without complication (HCC)   . Hypertension     Past Surgical History:  Procedure Laterality Date  . ABDOMINAL HYSTERECTOMY    . APPENDECTOMY    . CHOLECYSTECTOMY      Allergies as of 10/14/2019      Reactions   Pravastatin Other (See Comments)   ELEVATED LFT'S   Atorvastatin Other (See Comments)   Other reaction(s): MYALGIA Other reaction(s): MYALGIA   Codeine Nausea And Vomiting, Other (See Comments)   nausea Feels sick to her stomach  Altered mental status   Other Other (See Comments)   N/A   Aspirin Other  (See Comments)   unknown   Levothyroxine    Other reaction(s): PALPITATIONS   Pravastatin Sodium Other (See Comments)   Other reaction(s): ELEVATED LFT'S   Propoxyphene       Medication List       Accurate as of October 14, 2019  4:28 PM. If you have any questions, ask your nurse or doctor.        STOP taking these medications   potassium chloride SA 20 MEQ tablet Commonly known as: KLOR-CON Stopped by: Merrilee Seashore, MD     TAKE these medications   aspirin EC 81 MG tablet 81 mg. TAKE 1 TABLET BY MOUTH ONCE DAILY FOR 3 WEEKS START DATE 10/21/19 (DO NOT CRUSH) Start taking on: October 21, 2019   bisacodyl 10 MG suppository Commonly known as: DULCOLAX If not relieved by MOM, give 10 mg Bisacodyl suppositiory rectally X 1 dose in 24 hours as needed (Do not use constipation standing orders for residents with renal failure/CFR less than 30. Contact MD for orders) (Physician Order)   enoxaparin 40 MG/0.4ML injection Commonly known as: LOVENOX INJECT 0.4ML (40MG ) INTO SKIN EVERY 12 HOURS FOR 10 DAYS FOR DVT PROPHYLAXIS   glipiZIDE 10 MG tablet Commonly known as: GLUCOTROL Take 10 mg by mouth 2 (two) times daily before a meal. FOR DIABETES   insulin NPH-regular Human (70-30) 100 UNIT/ML injection Inject 36 Units into the skin. Before  breakfast   NovoLIN 70/30 (70-30) 100 UNIT/ML injection Generic drug: insulin NPH-regular Human Inject 26 units Subcutaneously with supper  lactulose 10 GM/15ML solution Commonly known as: CHRONULAC Take 45 mLs (30 g total) by mouth 3 (three) times daily.   lisinopril 20 MG tablet Commonly known as: ZESTRIL Take 20 mg by mouth daily. FOR ESSENTIAL HTN   magnesium hydroxide 400 MG/5ML suspension Commonly known as: MILK OF MAGNESIA If no BM in 3 days, give 30 cc Milk of Magnesium p.o. x 1 dose in 24 hours as needed (Do not use standing constipation orders for residents with renal failure CFR less than 30. Contact MD for orders) (Physician  Order)   magnesium oxide 400 MG tablet Commonly known as: MAG-OX Take 400 mg by mouth daily.   NON FORMULARY DIET : Regular NAS CCD Heart healthy   pramipexole 0.5 MG tablet Commonly known as: MIRAPEX Take 1 mg by mouth at bedtime.   RA SALINE ENEMA RE If not relieved by Biscodyl suppository, give disposable Saline Enema rectally X 1 dose/24 hrs as needed (Do not use constipation standing orders for residents with renal failure/CFR less than 30. Contact MD for orders)(Physician Or   rifaximin 550 MG Tabs tablet Commonly known as: XIFAXAN Take 550 mg by mouth 2 (two) times daily.   Synthroid 112 MCG tablet Generic drug: levothyroxine Take 112 mcg by mouth daily before breakfast.   traMADol 50 MG tablet Commonly known as: ULTRAM Take 1 tablet (50 mg total) by mouth every 6 (six) hours as needed for up to 7 days.   Vitamin D3 25 MCG tablet Commonly known as: Vitamin D Take 1,000 Units by mouth daily.       No orders of the defined types were placed in this encounter.   Immunization History  Administered Date(s) Administered  . Influenza Split 05/22/2011  . Influenza, High Dose Seasonal PF 05/12/2013, 08/30/2014, 06/04/2015, 05/24/2018, 05/25/2019  . Influenza, Seasonal, Injecte, Preservative Fre 05/06/2010, 04/28/2012  . Influenza-Unspecified 05/04/2013, 08/30/2014, 06/04/2015, 03/31/2016, 03/31/2016  . Pneumococcal Conjugate-13 12/14/2014, 12/14/2014  . Pneumococcal Polysaccharide-23 05/06/2010, 05/06/2010, 09/04/2010, 09/04/2010  . Zoster 08/18/2012, 08/18/2012    Social History   Tobacco Use  . Smoking status: Former Games developer  . Smokeless tobacco: Never Used  Substance Use Topics  . Alcohol use: No    Family history is   Family History  Problem Relation Age of Onset  . Cirrhosis Neg Hx       Review of Systems  GENERAL:  no fevers, fatigue, appetite changes SKIN: No itching, or rash EYES: No eye pain, redness, discharge EARS: No earache, tinnitus,  change in hearing NOSE: No congestion, drainage or bleeding  MOUTH/THROAT: No mouth or tooth pain, No sore throat RESPIRATORY: No cough, wheezing, SOB CARDIAC: No chest pain, palpitations, lower extremity edema  GI: No abdominal pain, No N/V/D or constipation, No heartburn or reflux  GU: No dysuria, frequency or urgency, or incontinence  MUSCULOSKELETAL: No unrelieved bone/joint pain NEUROLOGIC: No headache, dizziness or focal weakness PSYCHIATRIC: No c/o anxiety or sadness   Vitals:   10/14/19 1433  BP: 123/70  Pulse: 78  Resp: 18  Temp: 98 F (36.7 C)  SpO2: 97%    SpO2 Readings from Last 1 Encounters:  10/14/19 97%   Body mass index is 29.69 kg/m.     Physical Exam  GENERAL APPEARANCE: Alert, conversant,  No acute distress.  SKIN: No diaphoresis rash HEAD: Normocephalic, atraumatic  EYES: Conjunctiva/lids clear. Pupils round, reactive. EOMs intact.  EARS: External exam WNL, canals clear. Hearing grossly normal.  NOSE: No deformity or discharge.  MOUTH/THROAT: Lips w/o lesions  RESPIRATORY: Breathing is even, unlabored. Lung sounds are clear   CARDIOVASCULAR: Heart RRR no murmurs, rubs or gallops. No peripheral edema.   GASTROINTESTINAL: Abdomen is soft, non-tender, not distended w/ normal bowel sounds. GENITOURINARY: Bladder non tender, not distended  MUSCULOSKELETAL: No abnormal joints or musculature NEUROLOGIC:  Cranial nerves 2-12 grossly intact. Moves all extremities  PSYCHIATRIC: Mood and affect appropriate to situation, no behavioral issues  Patient Active Problem List   Diagnosis Date Noted  . Hepatic encephalopathy ([place]) 01/08/2017  . Acute hepatic encephalopathy 01/06/2017  . Cirrhosis ([place]) 01/06/2017  . Uncontrolled type 2 diabetes mellitus with hyperglycemia ([place]) 01/06/2017  . Portal hypertension ([place]) 01/06/2017  . Hyperbilirubinemia 01/06/2017  . Esophageal varices ([place]) 01/06/2017  . Atelectasis 01/06/2017  . Essential hypertension 01/06/2017   . Hypothyroidism, unspecified 01/06/2017      Labs reviewed: Basic Metabolic Panel:    Component Value Date/Time   NA 139 10/12/2019 2308   K 3.1 (L) 10/12/2019 2308   CL 102 10/12/2019 2308   CO2 28 10/12/2019 2308   GLUCOSE 262 (H) 10/12/2019 2308   BUN 7 (L) 10/12/2019 2308   CREATININE 0.34 (L) 10/12/2019 2308   CALCIUM 8.1 (L) 10/12/2019 2308   PROT 6.1 (L) 08/02/2019 0554   ALBUMIN 3.0 (L) 08/02/2019 0554   AST 41 08/02/2019 0554   ALT 21 08/02/2019 0554   ALKPHOS 70 08/02/2019 0554   BILITOT 1.3 (H) 08/02/2019 0554   GFRNONAA >60 10/12/2019 2308   GFRAA >60 10/12/2019 2308    Recent Labs    08/02/19 0554 10/12/19 2308  NA 140 139  K 4.2 3.1*  CL 108 102  CO2 23 28  GLUCOSE 330* 262*  BUN 10 7*  CREATININE 0.42* 0.34*  CALCIUM 8.6* 8.1*   Liver Function Tests: Recent Labs    08/02/19 0554  AST 41  ALT 21  ALKPHOS 70  BILITOT 1.3*  PROT 6.1*  ALBUMIN 3.0*   No results for input(s): LIPASE, AMYLASE in the last 8760 hours. No results for input(s): AMMONIA in the last 8760 hours. CBC: Recent Labs    08/02/19 0554 10/12/19 2308  WBC 2.6* 4.4  NEUTROABS  --  3.1  HGB 12.8 12.3  HCT 41.6 37.9  MCV 90.0 94.0  PLT 66* 105*   Lipid No results for input(s): CHOL, HDL, LDLCALC, TRIG in the last 8760 hours.  Cardiac Enzymes: No results for input(s): CKTOTAL, CKMB, CKMBINDEX, TROPONINI in the last 8760 hours. BNP: No results for input(s): BNP in the last 8760 hours. No results found for: [facility] Lab Results  Component Value Date   HGBA1C 11.5 (H) 01/08/2017   No results found for: TSH No results found for: VITAMINB12 No results found for: FOLATE No results found for: IRON, TIBC, FERRITIN  Imaging and Procedures obtained prior to SNF admission: DG Hip Unilat W or Wo Pelvis 2-3 Views Left  Result Date: 10/12/2019 CLINICAL DATA:  Hip pain status post hip replacement EXAM: DG HIP (WITH OR WITHOUT PELVIS) 2-3V LEFT COMPARISON:  10/05/2019,  10/06/2019 FINDINGS: Normal alignment of right hip. Pubic symphysis and rami are intact. Status post left hip replacement with normal alignment. No fracture is seen. Decreased soft tissue gas. IMPRESSION: Status post left hip replacement. No acute osseous abnormality. Small amount of soft tissue gas, likely resolving postoperative air. Electronically Signed   By: Donavan Foil M.D.   On: 10/12/2019 23:03     Not all labs, radiology exams or other studies done during hospitalization come through  on my EPIC note; however they are reviewed by me.    Assessment and Plan  Left hip fracture/status post arthroplasty-no postop complications; patient is to be discharged on Lovenox for 2 weeks and then ASA for another 4 weeks SNF-admitted for OT/PT; patient is to be on Lovenox for 2 weeks, then Plavix 75 mg daily for 4 weeks instead of aspirin to which patient is allergic  Elevated ammonia level-patient was started on rifaximin and lactulose SNF-no labs were sent with patient's paperwork but I found an ammonia level of 110 with high normal being 47 and had an endocrinology visit several days prior to her hospitalization.  Continue rifaximin 550 mg twice daily and lactulose 30 g 3 times daily  Diabetes mellitus SNF-not well controlled with recent A1c of 9.5; per endocrinology continue Glucotrol 10 mg twice daily, Novolin 70/30 30 units at breakfast and 15 units at supper  Hypothyroidism SNF-controlled; continue Synthroid 112 mcg daily  Hypertension SNF-controlled; continue lisinopril 20 mg daily   Full PPE was required for this visit.   Time spent greater than 45 minutes;> 50% of time with patient was spent reviewing records, labs, tests and studies, counseling and developing plan of care  Margit Hanks, MD

## 2019-10-15 ENCOUNTER — Encounter: Payer: Self-pay | Admitting: Internal Medicine

## 2019-10-15 DIAGNOSIS — R7989 Other specified abnormal findings of blood chemistry: Secondary | ICD-10-CM | POA: Insufficient documentation

## 2019-10-15 DIAGNOSIS — S72002A Fracture of unspecified part of neck of left femur, initial encounter for closed fracture: Secondary | ICD-10-CM | POA: Insufficient documentation

## 2019-10-18 ENCOUNTER — Other Ambulatory Visit: Payer: Self-pay | Admitting: Internal Medicine

## 2019-10-18 MED ORDER — CLOPIDOGREL BISULFATE 75 MG PO TABS: 75.0000 mg | ORAL_TABLET | Freq: Every day | ORAL | 0 refills | Status: DC

## 2019-10-27 ENCOUNTER — Non-Acute Institutional Stay (SKILLED_NURSING_FACILITY): Payer: Medicare HMO | Admitting: Internal Medicine

## 2019-10-27 DIAGNOSIS — E039 Hypothyroidism, unspecified: Secondary | ICD-10-CM

## 2019-10-27 DIAGNOSIS — R7989 Other specified abnormal findings of blood chemistry: Secondary | ICD-10-CM | POA: Diagnosis not present

## 2019-10-27 DIAGNOSIS — S72002D Fracture of unspecified part of neck of left femur, subsequent encounter for closed fracture with routine healing: Secondary | ICD-10-CM

## 2019-10-27 DIAGNOSIS — E1165 Type 2 diabetes mellitus with hyperglycemia: Secondary | ICD-10-CM

## 2019-10-27 DIAGNOSIS — I10 Essential (primary) hypertension: Secondary | ICD-10-CM

## 2019-10-28 ENCOUNTER — Other Ambulatory Visit: Payer: Self-pay | Admitting: Internal Medicine

## 2019-10-28 ENCOUNTER — Encounter: Payer: Self-pay | Admitting: Internal Medicine

## 2019-10-28 MED ORDER — LISINOPRIL 20 MG PO TABS: 20.0000 mg | ORAL_TABLET | Freq: Every day | ORAL | 0 refills | Status: AC

## 2019-10-28 MED ORDER — MAGNESIUM OXIDE 400 MG PO TABS: 400.0000 mg | ORAL_TABLET | Freq: Every day | ORAL | 0 refills | Status: AC

## 2019-10-28 MED ORDER — LACTULOSE 10 GM/15ML PO SOLN: 30.0000 g | Freq: Three times a day (TID) | ORAL | 0 refills | Status: AC

## 2019-10-28 MED ORDER — CLOPIDOGREL BISULFATE 75 MG PO TABS: 75.0000 mg | ORAL_TABLET | Freq: Every day | ORAL | 0 refills | Status: AC

## 2019-10-28 MED ORDER — RIFAXIMIN 550 MG PO TABS: 550.0000 mg | ORAL_TABLET | Freq: Two times a day (BID) | ORAL | 0 refills | Status: AC

## 2019-10-28 MED ORDER — NOVOLIN 70/30 (70-30) 100 UNIT/ML ~~LOC~~ SUSP: 26.0000 [IU] | Freq: Every day | SUBCUTANEOUS | 0 refills | Status: AC

## 2019-10-28 MED ORDER — PRAMIPEXOLE DIHYDROCHLORIDE 0.5 MG PO TABS: 1.0000 mg | ORAL_TABLET | Freq: Every day | ORAL | 0 refills | Status: AC

## 2019-10-28 MED ORDER — LEVOTHYROXINE SODIUM 112 MCG PO TABS: 112.0000 ug | ORAL_TABLET | Freq: Every day | ORAL | 0 refills | Status: AC

## 2019-10-28 MED ORDER — INSULIN NPH ISOPHANE & REGULAR (70-30) 100 UNIT/ML ~~LOC~~ SUSP: 36.0000 [IU] | Freq: Every day | SUBCUTANEOUS | 0 refills | Status: AC

## 2019-10-28 MED ORDER — VITAMIN D3 25 MCG PO TABS: 1000.0000 [IU] | ORAL_TABLET | Freq: Every day | ORAL | 0 refills | Status: AC

## 2019-10-28 MED ORDER — GLIPIZIDE 10 MG PO TABS: 10.0000 mg | ORAL_TABLET | Freq: Two times a day (BID) | ORAL | 0 refills | Status: AC

## 2019-10-28 NOTE — Progress Notes (Signed)
This is a discharge note.  Level care skilled.  [facility]  Chief complaint-discharge note.  History of present illness.  Patient is a pleasant 79 year old female who is slated for discharge home on Saturday, October 29, 2019.  She will be going home with family.  She has been here for rehab after after falling at home and sustaining a left hip fracture that was surgically repaired.  Postop course was unremarkable she has completed a course of Lovenox for DVT prophylaxis and is now on Plavix-she has a listed allergy to aspirin.  She will be on Plavix for 3 weeks to end on April 8.  She also was noted to have an elevated ammonia level and was started on rifaximin and lactulose.  Ammonia level has run in the low 100s and appears to be stable her mental status appears to be stable at this point she is pleasant and appropriate.  Her other diagnoses include type 2 diabetes she is followed by endocrinology and continues on Novolin insulin 7030-36 units at breakfast and 26 units at supper.  She also is on glipizide 10 mg p.o. twice daily.  She also has a history of hypothyroidism on Synthroid as well as hypertension on lisinopril 20 mg a day this appears to be stable recent blood pressures 115/74-135/75.  Past Medical History:  Diagnosis Date  . Diabetes mellitus without complication ([place])   . Hypertension          Past Surgical History:  Procedure Laterality Date  . ABDOMINAL HYSTERECTOMY    . APPENDECTOMY    . CHOLECYSTECTOMY           Allergies as of 10/14/2019      Reactions   Pravastatin Other (See Comments)   ELEVATED LFT'S   Atorvastatin Other (See Comments)   Other reaction(s): MYALGIA Other reaction(s): MYALGIA   Codeine Nausea And Vomiting, Other (See Comments)   nausea Feels sick to her stomach  Altered mental status   Other Other (See Comments)   N/A   Aspirin Other (See Comments)   unknown   Levothyroxine     Other reaction(s): PALPITATIONS   Pravastatin Sodium Other (See Comments)   Other reaction(s): ELEVATED LFT'S   Propoxyphene          Medication List       Accurate as of October 14, 2019  4:28 PM. If you have any questions, ask your nurse or doctor.        STOP taking these medications   potassium chloride SA 20 MEQ tablet Commonly known as: KLOR-CON Stopped by: Inocencio Homes, MD     TAKE these medications   aspirin EC 81 MG tablet 81 mg. TAKE 1 TABLET BY MOUTH ONCE DAILY FOR 3 WEEKS START DATE 10/21/19 (DO NOT CRUSH) Start taking on: October 21, 2019   bisacodyl 10 MG suppository Commonly known as: DULCOLAX If not relieved by MOM, give 10 mg Bisacodyl suppositiory rectally X 1 dose in 24 hours as needed (Do not use constipation standing orders for residents with renal failure/CFR less than 30. Contact MD for orders) (Physician Order)   enoxaparin 40 MG/0.4ML injection Commonly known as: LOVENOX INJECT 0.4ML (40MG ) INTO SKIN EVERY 12 HOURS FOR 10 DAYS FOR DVT PROPHYLAXIS   glipiZIDE 10 MG tablet Commonly known as: GLUCOTROL Take 10 mg by mouth 2 (two) times daily before a meal. FOR DIABETES   insulin NPH-regular Human (70-30) 100 UNIT/ML injection Inject 36 Units into the skin. Before  breakfast  NovoLIN 70/30 (70-30) 100 UNIT/ML injection Generic drug: insulin NPH-regular Human Inject 26 units Subcutaneously with supper   lactulose 10 GM/15ML solution Commonly known as: CHRONULAC Take 45 mLs (30 g total) by mouth 3 (three) times daily.   lisinopril 20 MG tablet Commonly known as: ZESTRIL Take 20 mg by mouth daily. FOR ESSENTIAL HTN   magnesium hydroxide 400 MG/5ML suspension Commonly known as: MILK OF MAGNESIA If no BM in 3 days, give 30 cc Milk of Magnesium p.o. x 1 dose in 24 hours as needed (Do not use standing constipation orders for residents with renal failure CFR less than 30. Contact MD for orders) (Physician Order)   magnesium oxide  400 MG tablet Commonly known as: MAG-OX Take 400 mg by mouth daily.   NON FORMULARY DIET : Regular NAS CCD Heart healthy   pramipexole 0.5 MG tablet Commonly known as: MIRAPEX Take 1 mg by mouth at bedtime.   RA SALINE ENEMA RE If not relieved by Biscodyl suppository, give disposable Saline Enema rectally X 1 dose/24 hrs as needed (Do not use constipation standing orders for residents with renal failure/CFR less than 30. Contact MD for orders)(Physician Or   rifaximin 550 MG Tabs tablet Commonly known as: XIFAXAN Take 550 mg by mouth 2 (two) times daily.   Synthroid 112 MCG tablet Generic drug: levothyroxine Take 112 mcg by mouth daily before breakfast.   traMADol 50 MG tablet Commonly known as: ULTRAM Take 1 tablet (50 mg total) by mouth every 6 (six) hours as needed for up to 7 days.   Vitamin D3 25 MCG tablet Commonly known as: Vitamin D Take 1,000 Units by mouth daily.       No orders of the defined types were placed in this encounter.       Immunization History  Administered Date(s) Administered  . Influenza Split 05/22/2011  . Influenza, High Dose Seasonal PF 05/12/2013, 08/30/2014, 06/04/2015, 05/24/2018, 05/25/2019  . Influenza, Seasonal, Injecte, Preservative Fre 05/06/2010, 04/28/2012  . Influenza-Unspecified 05/04/2013, 08/30/2014, 06/04/2015, 03/31/2016, 03/31/2016  . Pneumococcal Conjugate-13 12/14/2014, 12/14/2014  . Pneumococcal Polysaccharide-23 05/06/2010, 05/06/2010, 09/04/2010, 09/04/2010  . Zoster 08/18/2012, 08/18/2012    Social History       Tobacco Use  . Smoking status: Former Games developer  . Smokeless tobacco: Never Used  Substance Use Topics  . Alcohol use: No    Family history is        Family History  Problem Relation Age of Onset  . Cirrhosis Neg Hx    Review of systems.  In general she is not complaining of any fever or chills.  Skin does not complain of rashes or itching surgical site appears to be  healing unremarkably.  Head ears eyes nose mouth and throat is not complain of visual changes or sore throat.  Respiratory does not complain of being short of breath or having a cough.  Cardiac no complaints of chest pain or significantly increasing edema.  GI is not complaining of abdominal pain nausea vomiting diarrhea constipation.  GU no complaints of dysuria.  Musculoskeletal is not complaining of joint pain at this time.  Neurologic does have some weakness yet does not complain of syncope numbness or headache.  And psych does not complain of being depressed or anxious.  Physical exam.  Temperature is 97.1 pulse 68 respiration 17 blood pressure 135/75.  In general this is a pleasant elderly female in no distress.  Her skin is warm and dry surgical site left hip some Steri-Strips are in  place there appears to be a well-healed surgical scar I do not see evidence of infection.  Eyes visual acuity appears to be intact sclera and conjunctive are clear.  Chest is clear to auscultation there is no labored breathing.  Heart is regular rate and rhythm with 2/6 systolic murmur she has mild lower extremity edema a bit more on the surgical side pedal pulses are intact the edema is cool to touch nonerythematous nontender.  Abdomen is soft nontender with positive bowel sounds.  Musculoskeletal is able to move all extremities x4   Neurologic appears grossly intact cannot appreciate lateralizing findings her speech is clear.  Psych she continues to be pleasant and appropriate  Labs.  October 17, 2019.  Sodium 136 potassium 4.5 BUN 7.0 creatinine 0.24.  Calcium 8.4  Albumin 2.6 otherwise liver function tests within normal limits.  WBC 4.5 hemoglobin 11.6 platelets 154.  October 14, 2019.  WBC 3.9 hemoglobin 11.9 platelets 117.  Sodium 139 potassium 3.7 BUN 6.1 creatinine 0.28.  Assessment and plan.  1-history of left femoral fracture status post repair-she has completed  Lovenox she is now on a 3-week course of Plavix 75 mg a day-she is not on aspirin apparently because of a listed allergy to aspirin.  She will complete her Plavix  on April 8.  Her pain appears to be under adequate control she had received tramadol previously.  She will need continued PT and OT when she goes home as well as orthopedic follow-up.  2.  History of elevated ammonia level she has been started on rifaximin 500 mg twice a day as well as lactulose 30 g 3 times a day.  Ammonia level has shown some stability hovering around 100-this will warrant updating when she goes home her mental status appears to be  stable.  3.  History of diabetes she is followed by endocrinology she is now on Novolin 70/30-36 units at breakfast and 26 units at supper this was recently increased she is also on glipizide 10 mg a day.  Blood sugars appear to be stable recent ones  mainly in the 100s.  4.  History of hypertension she is on lisinopril 20 mg a day this appears to be stable recent blood pressures 115/74-135/75.  5.  History of restless leg she is not complaining of that today she is on Requip 1 mg nightly.  6.  History of hypothyroidism not stated as uncontrolled she is on Synthroid 125 mcg a day.  Again she will be going home will need PT OT as well as social worker support-she will need follow-up by orthopedics Also would like home health to draw a CBC metabolic panel and ammonia level on Monday, March 29 and notifyPCP of results for follow-up.  [id number] note greater than 30 minutes spent on this discharge summary-greater than 50% of time spent coordinating and formulating a plan of care for numerous diagnoses
